# Patient Record
Sex: Female | Born: 1960 | Race: White | Hispanic: No | Marital: Married | Smoking: Never smoker
Health system: Southern US, Community
[De-identification: ages and names within clinical notes are randomized; demographics above are authoritative.]

## PROBLEM LIST (undated history)

## (undated) DIAGNOSIS — Z789 Other specified health status: Secondary | ICD-10-CM

## (undated) HISTORY — PX: ABDOMINAL HYSTERECTOMY: SHX81

---

## 2018-03-06 DIAGNOSIS — K449 Diaphragmatic hernia without obstruction or gangrene: Secondary | ICD-10-CM

## 2018-03-06 DIAGNOSIS — R634 Abnormal weight loss: Secondary | ICD-10-CM | POA: Diagnosis not present

## 2018-03-06 DIAGNOSIS — R195 Other fecal abnormalities: Secondary | ICD-10-CM

## 2018-03-06 DIAGNOSIS — D509 Iron deficiency anemia, unspecified: Secondary | ICD-10-CM

## 2018-03-07 DIAGNOSIS — R195 Other fecal abnormalities: Secondary | ICD-10-CM | POA: Diagnosis not present

## 2018-03-07 DIAGNOSIS — K449 Diaphragmatic hernia without obstruction or gangrene: Secondary | ICD-10-CM | POA: Diagnosis not present

## 2018-03-07 DIAGNOSIS — D509 Iron deficiency anemia, unspecified: Secondary | ICD-10-CM | POA: Diagnosis not present

## 2018-03-07 DIAGNOSIS — R634 Abnormal weight loss: Secondary | ICD-10-CM | POA: Diagnosis not present

## 2018-03-08 DIAGNOSIS — D509 Iron deficiency anemia, unspecified: Secondary | ICD-10-CM | POA: Diagnosis not present

## 2018-03-08 DIAGNOSIS — R634 Abnormal weight loss: Secondary | ICD-10-CM | POA: Diagnosis not present

## 2018-03-08 DIAGNOSIS — K449 Diaphragmatic hernia without obstruction or gangrene: Secondary | ICD-10-CM | POA: Diagnosis not present

## 2018-03-08 DIAGNOSIS — R195 Other fecal abnormalities: Secondary | ICD-10-CM | POA: Diagnosis not present

## 2018-03-09 DIAGNOSIS — D509 Iron deficiency anemia, unspecified: Secondary | ICD-10-CM | POA: Diagnosis not present

## 2018-03-09 DIAGNOSIS — R195 Other fecal abnormalities: Secondary | ICD-10-CM | POA: Diagnosis not present

## 2018-03-09 DIAGNOSIS — Z791 Long term (current) use of non-steroidal anti-inflammatories (NSAID): Secondary | ICD-10-CM

## 2018-03-09 DIAGNOSIS — K259 Gastric ulcer, unspecified as acute or chronic, without hemorrhage or perforation: Secondary | ICD-10-CM

## 2018-08-01 DIAGNOSIS — Z01818 Encounter for other preprocedural examination: Secondary | ICD-10-CM | POA: Diagnosis not present

## 2018-11-30 HISTORY — PX: HERNIA REPAIR: SHX51

## 2019-01-23 DIAGNOSIS — K432 Incisional hernia without obstruction or gangrene: Secondary | ICD-10-CM | POA: Diagnosis not present

## 2019-01-23 DIAGNOSIS — K449 Diaphragmatic hernia without obstruction or gangrene: Secondary | ICD-10-CM

## 2019-01-23 DIAGNOSIS — R079 Chest pain, unspecified: Secondary | ICD-10-CM | POA: Diagnosis not present

## 2019-01-23 DIAGNOSIS — R509 Fever, unspecified: Secondary | ICD-10-CM | POA: Diagnosis not present

## 2019-01-23 DIAGNOSIS — R0789 Other chest pain: Secondary | ICD-10-CM | POA: Diagnosis not present

## 2019-01-24 DIAGNOSIS — K449 Diaphragmatic hernia without obstruction or gangrene: Secondary | ICD-10-CM | POA: Diagnosis not present

## 2019-01-24 DIAGNOSIS — R0789 Other chest pain: Secondary | ICD-10-CM

## 2019-01-24 DIAGNOSIS — R079 Chest pain, unspecified: Secondary | ICD-10-CM | POA: Diagnosis not present

## 2019-01-24 DIAGNOSIS — K432 Incisional hernia without obstruction or gangrene: Secondary | ICD-10-CM | POA: Diagnosis not present

## 2019-01-24 DIAGNOSIS — R509 Fever, unspecified: Secondary | ICD-10-CM | POA: Diagnosis not present

## 2019-02-01 ENCOUNTER — Ambulatory Visit: Payer: Self-pay | Admitting: General Surgery

## 2019-02-01 NOTE — H&P (Signed)
History of Present Illness Karina Gomez(Tiquan Bouch MD; 02/01/2019 2:24 PM) The patient is a 58 year old female who presents with a hiatal hernia. Chief Complaint: Recurrent hiatal hernia  Patient is a 58 year old female with no past medical history who comes in for recurrent hiatal hernia. Patient previously underwent a laparoscopic hiatal hernia repair with Nissen fundoplication by Dr. Lequita HaltMorgan in March 2020. Patient states that thereafter she was seen in the ER secondary to tachycardia. Patient states that she underwent stress test which was negative for any cardiac events. Patient also underwent CT scan. CT scan was significant for recurrent hiatal hernia, type for with transverse colon involved. Patient states that she's had no pain. She states that she's had no issues with reflux constipation. Patient states that she does still have tachycardic events sporadically.    Diagnostic Studies History Renee Ramus(Armen Ferguson, CMA; 02/01/2019 1:58 PM) Mammogram  within last year  Allergies Renee Ramus(Armen Ferguson, CMA; 02/01/2019 2:01 PM) Morphine Sulfate (PF) *ANALGESICS - OPIOID*   Medication History (Armen Ferguson, CMA; 02/01/2019 2:01 PM) Pantoprazole Sodium (40MG  Tablet DR, Oral) Active. Propranolol HCl (20MG  Tablet, Oral) Active. Medications Reconciled  Pregnancy / Birth History Renee Ramus(Armen Ferguson, CMA; 02/01/2019 1:58 PM) Age at menarche  14 years.  Other Problems Renee Ramus(Armen Ferguson, CMA; 02/01/2019 1:58 PM) Migraine Headache     Review of Systems Karina Gomez(Yoona Ishii MD; 02/01/2019 2:04 PM) General Not Present- Appetite Loss, Chills, Fatigue, Fever, Night Sweats, Weight Gain and Weight Loss. Skin Not Present- Change in Wart/Mole, Dryness, Hives, Jaundice, New Lesions, Non-Healing Wounds, Rash and Ulcer. HEENT Present- Wears glasses/contact lenses. Not Present- Earache, Hearing Loss, Hoarseness, Nose Bleed, Oral Ulcers, Ringing in the Ears, Seasonal Allergies, Sinus Pain, Sore Throat, Visual Disturbances and  Yellow Eyes. Breast Not Present- Breast Mass, Breast Pain, Nipple Discharge and Skin Changes. Cardiovascular Not Present- Chest Pain, Difficulty Breathing Lying Down, Leg Cramps, Palpitations, Rapid Heart Rate, Shortness of Breath and Swelling of Extremities. Gastrointestinal Not Present- Abdominal Pain, Bloating, Bloody Stool, Change in Bowel Habits, Chronic diarrhea, Constipation, Difficulty Swallowing, Excessive gas, Gets full quickly at meals, Hemorrhoids, Indigestion, Nausea, Rectal Pain and Vomiting. Musculoskeletal Not Present- Back Pain, Joint Pain, Joint Stiffness, Muscle Pain, Muscle Weakness and Swelling of Extremities. Neurological Not Present- Decreased Memory, Fainting, Headaches, Numbness, Seizures, Tingling, Tremor, Trouble walking and Weakness. Psychiatric Not Present- Anxiety, Bipolar, Change in Sleep Pattern, Depression, Fearful and Frequent crying. Endocrine Not Present- Cold Intolerance, Excessive Hunger, Hair Changes, Heat Intolerance and New Diabetes. Hematology Not Present- Blood Thinners, Easy Bruising, Excessive bleeding, Gland problems, HIV and Persistent Infections. All other systems negative  Vitals (Armen Ferguson CMA; 02/01/2019 2:01 PM) 02/01/2019 2:00 PM Weight: 117 lb Height: 63in Body Surface Area: 1.54 m Body Mass Index: 20.73 kg/m  Temp.: 98.11F  Pulse: 80 (Regular)  P.OX: 98% (Room air) BP: 118/72(Sitting, Left Arm, Standard)       Physical Exam Karina Gomez(Kenney Going MD; 02/01/2019 2:24 PM) The physical exam findings are as follows: Note: Constitutional: No acute distress, conversant, appears stated age  Eyes: Anicteric sclerae, moist conjunctiva, no lid lag  Neck: No thyromegaly, trachea midline, no cervical lymphadenopathy  Lungs: Clear to auscultation biilaterally, normal respiratory effot  Cardiovascular: regular rate & rhythm, no murmurs, no peripheal edema, pedal pulses 2+  GI: Soft, no masses or hepatosplenomegaly, non-tender to  palpation  MSK: Normal gait, no clubbing cyanosis, edema  Skin: No rashes, palpation reveals normal skin turgor  Psychiatric: Appropriate judgment and insight, oriented to person, place, and time  Abdomen Inspection  Assessment & Plan Ralene Ok MD; 02/01/2019 2:27 PM)  HIATAL HERNIA (K44.9) Impression: Patient is a 58 year old female with a type IV recurrent paraesophageal hernia. I discussed with the patient the pathophysiology of the recurrent hiatal hernia. Prior to surgery we'll obtain an upper GI/esophagogram, gastric emptying study.  1. Will proceed to the operating room for a robotic hiatal hernia repair with mesh and toupet Fundoplication  2. Discussed with patient the risks and benefits of the procedure to include but not limited to: Infection, bleeding, damage to structures, possible pneumothorax, these nerve damage, possible gastroparesis, possible recurrence. I discussed with her there is a higher chance of complications due to her recurrence and previous surgery. The patient voiced understanding and wishes to proceed.

## 2019-02-02 ENCOUNTER — Other Ambulatory Visit: Payer: Self-pay | Admitting: General Surgery

## 2019-02-02 ENCOUNTER — Other Ambulatory Visit (HOSPITAL_COMMUNITY): Payer: Self-pay | Admitting: General Surgery

## 2019-02-02 DIAGNOSIS — K449 Diaphragmatic hernia without obstruction or gangrene: Secondary | ICD-10-CM

## 2019-02-16 ENCOUNTER — Ambulatory Visit (HOSPITAL_COMMUNITY)
Admission: RE | Admit: 2019-02-16 | Discharge: 2019-02-16 | Disposition: A | Discharge status: Home / Self Care | Payer: Commercial Managed Care - PPO | Source: Ambulatory Visit | Attending: General Surgery | Admitting: General Surgery

## 2019-02-16 ENCOUNTER — Other Ambulatory Visit: Payer: Self-pay

## 2019-02-16 DIAGNOSIS — K449 Diaphragmatic hernia without obstruction or gangrene: Secondary | ICD-10-CM

## 2019-02-23 ENCOUNTER — Encounter (HOSPITAL_COMMUNITY)
Admission: RE | Admit: 2019-02-23 | Discharge: 2019-02-23 | Disposition: A | Discharge status: Home / Self Care | Payer: Commercial Managed Care - PPO | Source: Ambulatory Visit | Attending: General Surgery | Admitting: General Surgery

## 2019-02-23 ENCOUNTER — Other Ambulatory Visit: Payer: Self-pay

## 2019-02-23 DIAGNOSIS — K449 Diaphragmatic hernia without obstruction or gangrene: Secondary | ICD-10-CM | POA: Diagnosis not present

## 2019-02-23 MED ORDER — TECHNETIUM TC 99M SULFUR COLLOID
2.0600 | Freq: Once | INTRAVENOUS | Status: AC | PRN
  Administered 2019-02-23: 2.06 via INTRAVENOUS

## 2019-05-16 ENCOUNTER — Other Ambulatory Visit (HOSPITAL_COMMUNITY)
Admission: RE | Admit: 2019-05-16 | Discharge: 2019-05-16 | Disposition: A | Discharge status: Home / Self Care | Payer: Commercial Managed Care - PPO | Source: Ambulatory Visit | Attending: General Surgery | Admitting: General Surgery

## 2019-05-16 ENCOUNTER — Ambulatory Visit: Payer: Self-pay | Admitting: General Surgery

## 2019-05-16 ENCOUNTER — Other Ambulatory Visit (HOSPITAL_COMMUNITY): Payer: Commercial Managed Care - PPO

## 2019-05-16 DIAGNOSIS — Z01812 Encounter for preprocedural laboratory examination: Secondary | ICD-10-CM | POA: Insufficient documentation

## 2019-05-16 DIAGNOSIS — Z20828 Contact with and (suspected) exposure to other viral communicable diseases: Secondary | ICD-10-CM | POA: Insufficient documentation

## 2019-05-16 NOTE — Patient Instructions (Addendum)
DUE TO COVID-19 ONLY ONE VISITOR IS ALLOWED TO COME WITH YOU AND STAY IN THE WAITING ROOM ONLY DURING PRE OP AND PROCEDURE DAY OF SURGERY. THE 1 VISITOR MAY VISIT WITH YOU AFTER SURGERY IN YOUR PRIVATE ROOM DURING VISITING HOURS ONLY! YOU NEED TO HAVE A COVID 19 TEST ON___12/15____ @__9 :00_____, THIS TEST MUST BE DONE BEFORE SURGERY, COME  [address], [place] , .  [facility]) ONCE YOUR COVID TEST IS COMPLETED, PLEASE BEGIN THE QUARANTINE INSTRUCTIONS AS OUTLINED IN YOUR HANDOUT.                Nellene Courtois    Your procedure is scheduled on: 05/19/19   Report to [facility] Main  Entrance   Report to admitting at  11:30 AM     Call this number if you have problems the morning of surgery [phone]       BRUSH YOUR TEETH MORNING OF SURGERY AND RINSE YOUR MOUTH OUT, NO CHEWING GUM CANDY OR MINTS.   Do not eat food After Midnight.   YOU MAY HAVE CLEAR LIQUIDS FROM MIDNIGHT UNTIL 10:30 AM    CLEAR LIQUID DIET   Foods Allowed                                                                     Foods Excluded  Coffee and tea, regular and decaf                             liquids that you cannot  Plain Jell-O any favor except red or purple                                           see through such as: Fruit ices (not with fruit pulp)                                     milk, soups, orange juice  Iced Popsicles                                    All solid food Carbonated beverages, regular and diet                                    Cranberry, grape and apple juices Sports drinks like Gatorade Lightly seasoned clear broth or consume(fat free) Sugar, honey syrup     . At 10:30 AM Please finish the prescribed Pre-Surgery  drink.   Nothing by mouth after you finish the  drink !    Take these medicines the morning of surgery with A SIP OF WATER: none                                 You may not have any metal on your body  including hair pins and              piercings  Do not wear jewelry, make-up, lotions, powders or perfumes, deodorant             Do not wear nail polish on your fingernails.  Do not shave  48 hours prior to surgery.               Do not bring valuables to the hospital. [place].  Contacts, dentures or bridgework may not be worn into surgery.      Patients discharged the day of surgery will not be allowed to drive home.   IF YOU ARE HAVING SURGERY AND GOING HOME THE SAME DAY, YOU MUST HAVE AN ADULT TO DRIVE YOU HOME AND BE WITH YOU FOR 24 HOURS.   YOU MAY GO HOME BY TAXI OR UBER OR ORTHERWISE, BUT AN ADULT MUST ACCOMPANY YOU HOME AND STAY WITH YOU FOR 24 HOURS.  Name and phone number of your driver:  Special Instructions: N/A              Please read over the following fact sheets you were given: _____________________________________________________________________             [facility] - Preparing for Surgery  Before surgery, you can play an important role.    Because skin is not sterile, your skin needs to be as free of germs as possible.   You can reduce the number of germs on your skin by washing with CHG (chlorahexidine gluconate) soap before surgery.  CHG is an antiseptic cleaner which kills germs and bonds with the skin to continue killing germs even after washing. Please DO NOT use if you have an allergy to CHG or antibacterial soaps.   If your skin becomes reddened/irritated stop using the CHG and inform your nurse when you arrive at Short Stay. Do not shave (including legs and underarms) for at least 48 hours prior to the first CHG shower.    Please follow these instructions carefully:  1.  Shower with CHG Soap the night before surgery and the  morning of Surgery.  2.  If you choose to wash your hair, wash your hair first as usual with your  normal  shampoo.  3.  After you shampoo, rinse your hair and body thoroughly  to remove the  shampoo.                                        4.  Use CHG as you would any other liquid soap.  You can apply chg directly  to the skin and wash                       Gently with a scrungie or clean washcloth.  5.  Apply the CHG Soap to your body ONLY FROM THE NECK DOWN.   Do not use on face/ open                           Wound or open sores. Avoid contact with eyes, ears mouth and genitals (private parts).  Wash face,  Genitals (private parts) with your normal soap.             6.  Wash thoroughly, paying special attention to the area where your surgery  will be performed.  7.  Thoroughly rinse your body with warm water from the neck down.  8.  DO NOT shower/wash with your normal soap after using and rinsing off  the CHG Soap.             9.  Pat yourself dry with a clean towel.            10.  Wear clean pajamas.            11.  Place clean sheets on your bed the night of your first shower and do not  sleep with pets. Day of Surgery : Do not apply any lotions/deodorants the morning of surgery.  Please wear clean clothes to the hospital/surgery center.  FAILURE TO FOLLOW THESE INSTRUCTIONS MAY RESULT IN THE CANCELLATION OF YOUR SURGERY PATIENT SIGNATURE_________________________________  NURSE SIGNATURE__________________________________  ________________________________________________________________________

## 2019-05-17 ENCOUNTER — Encounter (HOSPITAL_COMMUNITY): Payer: Self-pay | Admitting: *Deleted

## 2019-05-17 ENCOUNTER — Inpatient Hospital Stay (HOSPITAL_COMMUNITY)
Admission: RE | Admit: 2019-05-17 | Discharge: 2019-05-17 | Disposition: A | Discharge status: Home / Self Care | Payer: Commercial Managed Care - PPO | Source: Ambulatory Visit

## 2019-05-17 ENCOUNTER — Other Ambulatory Visit (HOSPITAL_COMMUNITY): Payer: Self-pay | Admitting: *Deleted

## 2019-05-17 ENCOUNTER — Other Ambulatory Visit: Payer: Self-pay

## 2019-05-17 HISTORY — DX: Other specified health status: Z78.9

## 2019-05-17 LAB — NOVEL CORONAVIRUS, NAA (HOSP ORDER, SEND-OUT TO REF LAB; TAT 18-24 HRS): SARS-CoV-2, NAA: NOT DETECTED

## 2019-05-17 NOTE — Progress Notes (Signed)
PCP - Dr Chauncey Cruel. [place] Cardiologist - none  Chest x-ray -  no EKG - no Stress Test - no ECHO - no Cardiac Cath - no  Sleep Study - no CPAP -   Fasting Blood Sugar - NA Checks Blood Sugar _____ times a day  Blood Thinner Instructions: NA Aspirin Instructions: Last Dose:  Anesthesia review:   Patient denies shortness of breath, fever, cough and chest pain at PAT appointment yes  Patient verbalized understanding of instructions that were given to them at the PAT appointment. Patient was also instructed that they will need to review over the PAT instructions again at home before surgery. yes

## 2019-05-18 ENCOUNTER — Encounter (HOSPITAL_COMMUNITY)
Admission: RE | Admit: 2019-05-18 | Discharge: 2019-05-18 | Disposition: A | Discharge status: Home / Self Care | Payer: Commercial Managed Care - PPO | Source: Ambulatory Visit | Attending: General Surgery | Admitting: General Surgery

## 2019-05-18 DIAGNOSIS — Z01812 Encounter for preprocedural laboratory examination: Secondary | ICD-10-CM | POA: Insufficient documentation

## 2019-05-18 LAB — CBC
HCT: 41.7 % (ref 36.0–46.0)
Hemoglobin: 13.8 g/dL (ref 12.0–15.0)
MCH: 30.5 pg (ref 26.0–34.0)
MCHC: 33.1 g/dL (ref 30.0–36.0)
MCV: 92.1 fL (ref 80.0–100.0)
Platelets: 175 10*3/uL (ref 150–400)
RBC: 4.53 MIL/uL (ref 3.87–5.11)
RDW: 13.1 % (ref 11.5–15.5)
WBC: 4.1 10*3/uL (ref 4.0–10.5)
nRBC: 0 % (ref 0.0–0.2)

## 2019-05-18 NOTE — H&P (Signed)
History of Present Illness The patient is a 58 year old female who presents with a hiatal hernia.  Chief Complaint: Recurrent hiatal hernia  Patient is a 58 year old female with no past medical history who comes in for recurrent hiatal hernia. Patient previously underwent a laparoscopic hiatal hernia repair with Nissen fundoplication by Dr. Lilia Pro in March 2020. Patient states that thereafter she was seen in the ER secondary to tachycardia. Patient states that she underwent stress test which was negative for any cardiac events. Patient also underwent CT scan. CT scan was significant for recurrent hiatal hernia, type for with transverse colon involved. Patient states that she's had no pain. She states that she's had no issues with reflux constipation. Patient states that she does still have tachycardic events sporadically.  Diagnostic Studies History Mammogram within last year  Allergies Morphine Sulfate (PF) *ANALGESICS - OPIOID*  Medication History Pantoprazole Sodium (40MG  Tablet DR, Oral) Active.  Propranolol HCl (20MG  Tablet, Oral) Active.  Medications Reconciled  Pregnancy / Birth History  Age at menarche 74 years.  Other Problems ( Migraine Headache  Review of Systems General Not Present- Appetite Loss, Chills, Fatigue, Fever, Night Sweats, Weight Gain and Weight Loss.  Skin Not Present- Change in Wart/Mole, Dryness, Hives, Jaundice, New Lesions, Non-Healing Wounds, Rash and Ulcer.  HEENT Present- Wears glasses/contact lenses. Not Present- Earache, Hearing Loss, Hoarseness, Nose Bleed, Oral Ulcers, Ringing in the Ears, Seasonal Allergies, Sinus Pain, Sore Throat, Visual Disturbances and Yellow Eyes.  Breast Not Present- Breast Mass, Breast Pain, Nipple Discharge and Skin Changes.  Cardiovascular Not Present- Chest Pain, Difficulty Breathing Lying Down, Leg Cramps, Palpitations, Rapid Heart Rate, Shortness of Breath and Swelling of Extremities.  Gastrointestinal Not Present- Abdominal  Pain, Bloating, Bloody Stool, Change in Bowel Habits, Chronic diarrhea, Constipation, Difficulty Swallowing, Excessive gas, Gets full quickly at meals, Hemorrhoids, Indigestion, Nausea, Rectal Pain and Vomiting.  Musculoskeletal Not Present- Back Pain, Joint Pain, Joint Stiffness, Muscle Pain, Muscle Weakness and Swelling of Extremities.  Neurological Not Present- Decreased Memory, Fainting, Headaches, Numbness, Seizures, Tingling, Tremor, Trouble walking and Weakness.  Psychiatric Not Present- Anxiety, Bipolar, Change in Sleep Pattern, Depression, Fearful and Frequent crying.  Endocrine Not Present- Cold Intolerance, Excessive Hunger, Hair Changes, Heat Intolerance and New Diabetes.  Hematology Not Present- Blood Thinners, Easy Bruising, Excessive bleeding, Gland problems, HIV and Persistent Infections.  All other systems negative  Vitals9/07/2018 2:00 PM  Weight: 117 lb Height: 63 in  Body Surface Area: 1.54 m Body Mass Index: 20.73 kg/m  Temp.: 98.1 F Pulse: 80 (Regular) P.OX: 98% (Room air)  BP: 118/72(Sitting, Left Arm, Standard)  Physical Exam  The physical exam findings are as follows:  Note: Constitutional: No acute distress, conversant, appears stated age  Eyes: Anicteric sclerae, moist conjunctiva, no lid lag  Neck: No thyromegaly, trachea midline, no cervical lymphadenopathy  Lungs: Clear to auscultation biilaterally, normal respiratory effot  Cardiovascular: regular rate & rhythm, no murmurs, no peripheal edema, pedal pulses 2+  GI: Soft, no masses or hepatosplenomegaly, non-tender to palpation  MSK: Normal gait, no clubbing cyanosis, edema  Skin: No rashes, palpation reveals normal skin turgor  Psychiatric: Appropriate judgment and insight, oriented to person, place, and time  Abdomen  Inspection  Assessment & Plan  HIATAL HERNIA (K44.9)  Impression: Patient is a 58 year old female with a type IV recurrent paraesophageal hernia.  I discussed with the patient the  pathophysiology of the recurrent hiatal hernia.  Prior to surgery we'll obtain an upper GI/esophagogram, gastric emptying study.  1.  Will proceed to the operating room for a robotic hiatal hernia repair with mesh and toupet Fundoplication  2. Discussed with patient the risks and benefits of the procedure to include but not limited to: Infection, bleeding, damage to structures, possible pneumothorax, these nerve damage, possible gastroparesis, possible recurrence. I discussed with her there is a higher chance of complications due to her recurrence and previous surgery. The patient voiced understanding and wishes to proceed.

## 2019-05-19 ENCOUNTER — Encounter (HOSPITAL_COMMUNITY)
Admission: RE | Disposition: A | Discharge status: Home / Self Care | Payer: Self-pay | Source: Home / Self Care | Attending: General Surgery

## 2019-05-19 ENCOUNTER — Inpatient Hospital Stay (HOSPITAL_COMMUNITY)
Admission: RE | Admit: 2019-05-19 | Discharge: 2019-05-21 | DRG: 327 | Disposition: A | Discharge status: Home / Self Care | Payer: Commercial Managed Care - PPO | Attending: General Surgery | Admitting: General Surgery

## 2019-05-19 ENCOUNTER — Encounter (HOSPITAL_COMMUNITY): Payer: Self-pay | Admitting: General Surgery

## 2019-05-19 ENCOUNTER — Other Ambulatory Visit: Payer: Self-pay

## 2019-05-19 ENCOUNTER — Inpatient Hospital Stay (HOSPITAL_COMMUNITY): Payer: Commercial Managed Care - PPO | Admitting: Physician Assistant

## 2019-05-19 ENCOUNTER — Inpatient Hospital Stay (HOSPITAL_COMMUNITY): Payer: Commercial Managed Care - PPO | Admitting: Certified Registered"

## 2019-05-19 DIAGNOSIS — K9189 Other postprocedural complications and disorders of digestive system: Principal | ICD-10-CM | POA: Diagnosis present

## 2019-05-19 DIAGNOSIS — Y838 Other surgical procedures as the cause of abnormal reaction of the patient, or of later complication, without mention of misadventure at the time of the procedure: Secondary | ICD-10-CM | POA: Diagnosis present

## 2019-05-19 DIAGNOSIS — Y69 Unspecified misadventure during surgical and medical care: Secondary | ICD-10-CM | POA: Diagnosis not present

## 2019-05-19 DIAGNOSIS — K449 Diaphragmatic hernia without obstruction or gangrene: Secondary | ICD-10-CM

## 2019-05-19 DIAGNOSIS — K9171 Accidental puncture and laceration of a digestive system organ or structure during a digestive system procedure: Secondary | ICD-10-CM | POA: Diagnosis not present

## 2019-05-19 DIAGNOSIS — K660 Peritoneal adhesions (postprocedural) (postinfection): Secondary | ICD-10-CM | POA: Diagnosis present

## 2019-05-19 DIAGNOSIS — Z20828 Contact with and (suspected) exposure to other viral communicable diseases: Secondary | ICD-10-CM | POA: Diagnosis present

## 2019-05-19 DIAGNOSIS — Z9889 Other specified postprocedural states: Secondary | ICD-10-CM

## 2019-05-19 SURGERY — FUNDOPLICATION, NISSEN, ROBOT-ASSISTED, LAPAROSCOPIC
Anesthesia: General | Site: Abdomen

## 2019-05-19 MED ORDER — SCOPOLAMINE 1 MG/3DAYS TD PT72
MEDICATED_PATCH | TRANSDERMAL | Status: AC
  Filled 2019-05-19: qty 1

## 2019-05-19 MED ORDER — HYDROMORPHONE HCL 1 MG/ML IJ SOLN: 0.2500 mg | INTRAMUSCULAR | Status: DC | PRN

## 2019-05-19 MED ORDER — LACTATED RINGERS IR SOLN
Status: DC | PRN
  Administered 2019-05-19: 1000 mL

## 2019-05-19 MED ORDER — HYDROMORPHONE HCL 1 MG/ML IJ SOLN
1.0000 mg | INTRAMUSCULAR | Status: DC | PRN
  Administered 2019-05-19 – 2019-05-21 (×4): 1 mg via INTRAVENOUS
  Filled 2019-05-19 (×4): qty 1

## 2019-05-19 MED ORDER — ENSURE PRE-SURGERY PO LIQD
592.0000 mL | Freq: Once | ORAL | Status: DC
  Filled 2019-05-19: qty 592

## 2019-05-19 MED ORDER — LIDOCAINE 2% (20 MG/ML) 5 ML SYRINGE
INTRAMUSCULAR | Status: AC
  Filled 2019-05-19: qty 5

## 2019-05-19 MED ORDER — SUGAMMADEX SODIUM 200 MG/2ML IV SOLN
INTRAVENOUS | Status: DC | PRN
  Administered 2019-05-19: 200 mg via INTRAVENOUS

## 2019-05-19 MED ORDER — DEXAMETHASONE SODIUM PHOSPHATE 10 MG/ML IJ SOLN
INTRAMUSCULAR | Status: AC
  Filled 2019-05-19: qty 1

## 2019-05-19 MED ORDER — FENTANYL CITRATE (PF) 100 MCG/2ML IJ SOLN
INTRAMUSCULAR | Status: AC
  Filled 2019-05-19: qty 2

## 2019-05-19 MED ORDER — ONDANSETRON HCL 4 MG/2ML IJ SOLN
INTRAMUSCULAR | Status: AC
  Filled 2019-05-19: qty 2

## 2019-05-19 MED ORDER — ONDANSETRON HCL 4 MG/2ML IJ SOLN
4.0000 mg | Freq: Four times a day (QID) | INTRAMUSCULAR | Status: DC | PRN
  Administered 2019-05-20: 4 mg via INTRAVENOUS
  Filled 2019-05-19: qty 2

## 2019-05-19 MED ORDER — LIDOCAINE 20MG/ML (2%) 15 ML SYRINGE OPTIME
INTRAMUSCULAR | Status: DC | PRN
  Administered 2019-05-19: 1.5 mg/kg/h via INTRAVENOUS

## 2019-05-19 MED ORDER — KETAMINE HCL 10 MG/ML IJ SOLN
INTRAMUSCULAR | Status: DC | PRN
  Administered 2019-05-19 (×2): 10 mg via INTRAVENOUS
  Administered 2019-05-19: 15 mg via INTRAVENOUS

## 2019-05-19 MED ORDER — ENSURE PRE-SURGERY PO LIQD
296.0000 mL | Freq: Once | ORAL | Status: DC
  Filled 2019-05-19: qty 296

## 2019-05-19 MED ORDER — PHENYLEPHRINE 40 MCG/ML (10ML) SYRINGE FOR IV PUSH (FOR BLOOD PRESSURE SUPPORT)
PREFILLED_SYRINGE | INTRAVENOUS | Status: DC | PRN
  Administered 2019-05-19: 40 ug via INTRAVENOUS
  Administered 2019-05-19: 80 ug via INTRAVENOUS
  Administered 2019-05-19: 40 ug via INTRAVENOUS

## 2019-05-19 MED ORDER — DEXAMETHASONE SODIUM PHOSPHATE 10 MG/ML IJ SOLN
INTRAMUSCULAR | Status: DC | PRN
  Administered 2019-05-19: 8 mg via INTRAVENOUS

## 2019-05-19 MED ORDER — PROPOFOL 10 MG/ML IV BOLUS
INTRAVENOUS | Status: AC
  Filled 2019-05-19: qty 20

## 2019-05-19 MED ORDER — 0.9 % SODIUM CHLORIDE (POUR BTL) OPTIME
TOPICAL | Status: DC | PRN
  Administered 2019-05-19: 1000 mL

## 2019-05-19 MED ORDER — FENTANYL CITRATE (PF) 250 MCG/5ML IJ SOLN
INTRAMUSCULAR | Status: DC | PRN
  Administered 2019-05-19: 50 ug via INTRAVENOUS
  Administered 2019-05-19: 100 ug via INTRAVENOUS
  Administered 2019-05-19: 50 ug via INTRAVENOUS

## 2019-05-19 MED ORDER — DEXTROSE-NACL 5-0.9 % IV SOLN: INTRAVENOUS | Status: DC

## 2019-05-19 MED ORDER — LACTATED RINGERS IV SOLN: INTRAVENOUS | Status: DC

## 2019-05-19 MED ORDER — ONDANSETRON HCL 4 MG/2ML IJ SOLN
INTRAMUSCULAR | Status: DC | PRN
  Administered 2019-05-19 (×2): 4 mg via INTRAVENOUS

## 2019-05-19 MED ORDER — ROCURONIUM BROMIDE 10 MG/ML (PF) SYRINGE
PREFILLED_SYRINGE | INTRAVENOUS | Status: AC
  Filled 2019-05-19: qty 10

## 2019-05-19 MED ORDER — BUPIVACAINE HCL (PF) 0.25 % IJ SOLN
INTRAMUSCULAR | Status: DC | PRN
  Administered 2019-05-19: 18 mL

## 2019-05-19 MED ORDER — PHENYLEPHRINE HCL-NACL 10-0.9 MG/250ML-% IV SOLN
INTRAVENOUS | Status: DC | PRN
  Administered 2019-05-19: 20 ug/min via INTRAVENOUS

## 2019-05-19 MED ORDER — ONDANSETRON 4 MG PO TBDP: 4.0000 mg | ORAL_TABLET | Freq: Four times a day (QID) | ORAL | Status: DC | PRN

## 2019-05-19 MED ORDER — SCOPOLAMINE 1 MG/3DAYS TD PT72
MEDICATED_PATCH | TRANSDERMAL | Status: DC | PRN
  Administered 2019-05-19: 1 via TRANSDERMAL

## 2019-05-19 MED ORDER — MIDAZOLAM HCL 2 MG/2ML IJ SOLN
INTRAMUSCULAR | Status: DC | PRN
  Administered 2019-05-19: 2 mg via INTRAVENOUS

## 2019-05-19 MED ORDER — ENOXAPARIN SODIUM 40 MG/0.4ML ~~LOC~~ SOLN
40.0000 mg | SUBCUTANEOUS | Status: DC
  Administered 2019-05-20 – 2019-05-21 (×2): 40 mg via SUBCUTANEOUS
  Filled 2019-05-19 (×2): qty 0.4

## 2019-05-19 MED ORDER — LIDOCAINE 2% (20 MG/ML) 5 ML SYRINGE
INTRAMUSCULAR | Status: DC | PRN
  Administered 2019-05-19: 60 mg via INTRAVENOUS

## 2019-05-19 MED ORDER — ROCURONIUM BROMIDE 10 MG/ML (PF) SYRINGE
PREFILLED_SYRINGE | INTRAVENOUS | Status: DC | PRN
  Administered 2019-05-19: 20 mg via INTRAVENOUS
  Administered 2019-05-19: 10 mg via INTRAVENOUS
  Administered 2019-05-19: 20 mg via INTRAVENOUS
  Administered 2019-05-19: 40 mg via INTRAVENOUS
  Administered 2019-05-19: 20 mg via INTRAVENOUS

## 2019-05-19 MED ORDER — SUCCINYLCHOLINE CHLORIDE 200 MG/10ML IV SOSY
PREFILLED_SYRINGE | INTRAVENOUS | Status: DC | PRN
  Administered 2019-05-19: 60 mg via INTRAVENOUS

## 2019-05-19 MED ORDER — KETAMINE HCL 10 MG/ML IJ SOLN
INTRAMUSCULAR | Status: AC
  Filled 2019-05-19: qty 1

## 2019-05-19 MED ORDER — PROMETHAZINE HCL 25 MG/ML IJ SOLN: 6.2500 mg | INTRAMUSCULAR | Status: DC | PRN

## 2019-05-19 MED ORDER — CHLORHEXIDINE GLUCONATE CLOTH 2 % EX PADS: 6.0000 | MEDICATED_PAD | Freq: Once | CUTANEOUS | Status: DC

## 2019-05-19 MED ORDER — GLYCOPYRROLATE 0.2 MG/ML IJ SOLN
INTRAMUSCULAR | Status: DC | PRN
  Administered 2019-05-19: .2 mg via INTRAVENOUS

## 2019-05-19 MED ORDER — CEFAZOLIN SODIUM-DEXTROSE 2-4 GM/100ML-% IV SOLN
2.0000 g | INTRAVENOUS | Status: AC
  Administered 2019-05-19 (×2): 2 g via INTRAVENOUS
  Filled 2019-05-19: qty 100

## 2019-05-19 MED ORDER — MIDAZOLAM HCL 2 MG/2ML IJ SOLN
INTRAMUSCULAR | Status: AC
  Filled 2019-05-19: qty 2

## 2019-05-19 MED ORDER — PHENYLEPHRINE 40 MCG/ML (10ML) SYRINGE FOR IV PUSH (FOR BLOOD PRESSURE SUPPORT)
PREFILLED_SYRINGE | INTRAVENOUS | Status: AC
  Filled 2019-05-19: qty 10

## 2019-05-19 MED ORDER — BUPIVACAINE HCL 0.25 % IJ SOLN
INTRAMUSCULAR | Status: AC
  Filled 2019-05-19: qty 1

## 2019-05-19 MED ORDER — KETOROLAC TROMETHAMINE 30 MG/ML IJ SOLN: 30.0000 mg | Freq: Once | INTRAMUSCULAR | Status: DC | PRN

## 2019-05-19 MED ORDER — ACETAMINOPHEN 500 MG PO TABS
1000.0000 mg | ORAL_TABLET | Freq: Once | ORAL | Status: AC
  Administered 2019-05-19: 11:00:00 1000 mg via ORAL
  Filled 2019-05-19: qty 2

## 2019-05-19 MED ORDER — OXYCODONE HCL 5 MG/5ML PO SOLN: 5.0000 mg | Freq: Once | ORAL | Status: DC | PRN

## 2019-05-19 MED ORDER — PROPOFOL 10 MG/ML IV BOLUS
INTRAVENOUS | Status: DC | PRN
  Administered 2019-05-19: 150 mg via INTRAVENOUS

## 2019-05-19 MED ORDER — ACETAMINOPHEN 500 MG PO TABS: 1000.0000 mg | ORAL_TABLET | ORAL | Status: DC

## 2019-05-19 MED ORDER — MEPERIDINE HCL 50 MG/ML IJ SOLN: 6.2500 mg | INTRAMUSCULAR | Status: DC | PRN

## 2019-05-19 MED ORDER — OXYCODONE HCL 5 MG PO TABS: 5.0000 mg | ORAL_TABLET | Freq: Once | ORAL | Status: DC | PRN

## 2019-05-19 SURGICAL SUPPLY — 59 items
APPLIER CLIP 5 13 M/L LIGAMAX5 (MISCELLANEOUS)
APPLIER CLIP ROT 10 11.4 M/L (STAPLE)
BLADE SURG SZ11 CARB STEEL (BLADE) ×3 IMPLANT
CHLORAPREP W/TINT 26 (MISCELLANEOUS) ×3 IMPLANT
CLIP APPLIE 5 13 M/L LIGAMAX5 (MISCELLANEOUS) IMPLANT
CLIP APPLIE ROT 10 11.4 M/L (STAPLE) IMPLANT
CLIP VESOLOCK LG 6/CT PURPLE (CLIP) IMPLANT
CLIP VESOLOCK MED LG 6/CT (CLIP) IMPLANT
COVER SURGICAL LIGHT HANDLE (MISCELLANEOUS) ×3 IMPLANT
COVER TIP SHEARS 8 DVNC (MISCELLANEOUS) IMPLANT
COVER TIP SHEARS 8MM DA VINCI (MISCELLANEOUS)
COVER WAND RF STERILE (DRAPES) ×3 IMPLANT
DECANTER SPIKE VIAL GLASS SM (MISCELLANEOUS) ×3 IMPLANT
DERMABOND ADVANCED (GAUZE/BANDAGES/DRESSINGS) ×2
DERMABOND ADVANCED .7 DNX12 (GAUZE/BANDAGES/DRESSINGS) ×1 IMPLANT
DRAIN PENROSE 18X1/2 LTX STRL (DRAIN) ×3 IMPLANT
DRAPE ARM DVNC X/XI (DISPOSABLE) ×4 IMPLANT
DRAPE COLUMN DVNC XI (DISPOSABLE) ×1 IMPLANT
DRAPE DA VINCI XI ARM (DISPOSABLE) ×8
DRAPE DA VINCI XI COLUMN (DISPOSABLE) ×2
ELECT REM PT RETURN 15FT ADLT (MISCELLANEOUS) ×3 IMPLANT
ENDOLOOP SUT PDS II  0 18 (SUTURE)
ENDOLOOP SUT PDS II 0 18 (SUTURE) IMPLANT
GAUZE 4X4 16PLY RFD (DISPOSABLE) ×3 IMPLANT
GLOVE BIO SURGEON STRL SZ7.5 (GLOVE) ×6 IMPLANT
GOWN STRL REUS W/TWL XL LVL3 (GOWN DISPOSABLE) ×12 IMPLANT
KIT BASIN OR (CUSTOM PROCEDURE TRAY) ×3 IMPLANT
KIT TURNOVER KIT A (KITS) IMPLANT
MARKER SKIN DUAL TIP RULER LAB (MISCELLANEOUS) ×3 IMPLANT
MESH BIO-A 7X10 SYN MAT (Mesh General) ×2 IMPLANT
NDL INSUFFLATION 14GA 120MM (NEEDLE) ×1 IMPLANT
NEEDLE HYPO 22GX1.5 SAFETY (NEEDLE) ×3 IMPLANT
NEEDLE INSUFFLATION 14GA 120MM (NEEDLE) ×3 IMPLANT
OBTURATOR OPTICAL STANDARD 8MM (TROCAR)
OBTURATOR OPTICAL STND 8 DVNC (TROCAR)
OBTURATOR OPTICALSTD 8 DVNC (TROCAR) IMPLANT
PACK CARDIOVASCULAR III (CUSTOM PROCEDURE TRAY) ×3 IMPLANT
PAD POSITIONING PINK XL (MISCELLANEOUS) ×3 IMPLANT
PENCIL SMOKE EVACUATOR (MISCELLANEOUS) IMPLANT
SCISSORS LAP 5X35 DISP (ENDOMECHANICALS) IMPLANT
SEAL CANN UNIV 5-8 DVNC XI (MISCELLANEOUS) ×4 IMPLANT
SEAL XI 5MM-8MM UNIVERSAL (MISCELLANEOUS) ×8
SEALER VESSEL DA VINCI XI (MISCELLANEOUS) ×2
SEALER VESSEL EXT DVNC XI (MISCELLANEOUS) ×1 IMPLANT
SET IRRIG TUBING LAPAROSCOPIC (IRRIGATION / IRRIGATOR) ×3 IMPLANT
SOL ANTI FOG 6CC (MISCELLANEOUS) ×1 IMPLANT
SOLUTION ANTI FOG 6CC (MISCELLANEOUS) ×2
SOLUTION ELECTROLUBE (MISCELLANEOUS) ×3 IMPLANT
STOPCOCK 4 WAY LG BORE MALE ST (IV SETS) ×4 IMPLANT
SUT ETHIBOND 0 36 GRN (SUTURE) ×6 IMPLANT
SUT MNCRL AB 4-0 PS2 18 (SUTURE) ×3 IMPLANT
SUT SILK 0 SH 30 (SUTURE) ×3 IMPLANT
SUT SILK 2 0 SH (SUTURE) ×6 IMPLANT
SYR 20ML LL LF (SYRINGE) ×3 IMPLANT
TOWEL OR 17X26 10 PK STRL BLUE (TOWEL DISPOSABLE) ×3 IMPLANT
TOWEL OR NON WOVEN STRL DISP B (DISPOSABLE) ×3 IMPLANT
TRAY FOLEY MTR SLVR 16FR STAT (SET/KITS/TRAYS/PACK) IMPLANT
TROCAR ADV FIXATION 5X100MM (TROCAR) ×3 IMPLANT
TUBING INSUFFLATION 10FT LAP (TUBING) ×3 IMPLANT

## 2019-05-19 NOTE — Transfer of Care (Signed)
Immediate Anesthesia Transfer of Care Note  Patient: Karina Gomez  Procedure(s) Performed: XI ROBOTIC ASSISTED REDO HIATAL HERNIA REPAIR WITH MESH AND NISSEN FUNDOPLICATION (N/A Abdomen)  Patient Location: PACU  Anesthesia Type:General  Level of Consciousness: drowsy  Airway & Oxygen Therapy: Patient Spontanous Breathing and Patient connected to face mask oxygen  Post-op Assessment: Report given to RN and Post -op Vital signs reviewed and stable  Post vital signs: Reviewed and stable  Last Vitals:  Vitals Value Taken Time  BP 120/70 05/19/19 1627  Temp    Pulse 82 05/19/19 1628  Resp 18 05/19/19 1628  SpO2 100 % 05/19/19 1628  Vitals shown include unvalidated device data.  Last Pain:  Vitals:   05/19/19 1029  TempSrc:   PainSc: 0-No pain         Complications: No apparent anesthesia complications

## 2019-05-19 NOTE — Interval H&P Note (Signed)
History and Physical Interval Note:  05/19/2019 11:44 AM  Karina Gomez  has presented today for surgery, with the diagnosis of RECURRENT HIATAL HERNIA TYPE IV.  The various methods of treatment have been discussed with the patient and family. After consideration of risks, benefits and other options for treatment, the patient has consented to  Procedure(s): XI ROBOTIC [place] FUNDOPLICATION (N/A) as a surgical intervention.  The patient's history has been reviewed, patient examined, no change in status, stable for surgery.  I have reviewed the patient's chart and labs.  Questions were answered to the patient's satisfaction.     Ralene Ok

## 2019-05-19 NOTE — Anesthesia Preprocedure Evaluation (Addendum)
Anesthesia Evaluation  Patient identified by MRN, date of birth, ID band Patient awake    Reviewed: Allergy & Precautions, NPO status , Patient's Chart, lab work & pertinent test results  Airway Mallampati: II  TM Distance: >3 FB Neck ROM: Full    Dental no notable dental hx. (+) Teeth Intact, Dental Advisory Given   Pulmonary neg pulmonary ROS,    Pulmonary exam normal breath sounds clear to auscultation       Cardiovascular negative cardio ROS Normal cardiovascular exam Rhythm:Regular Rate:Normal     Neuro/Psych negative neurological ROS  negative psych ROS   GI/Hepatic Neg liver ROS, hiatal hernia, GERD  Medicated and Controlled,Recurrent hiatal hernia   Endo/Other  negative endocrine ROS  Renal/GU negative Renal ROS  negative genitourinary   Musculoskeletal negative musculoskeletal ROS (+)   Abdominal Normal abdominal exam  (+)   Peds negative pediatric ROS (+)  Hematology negative hematology ROS (+)   Anesthesia Other Findings HOH   Reproductive/Obstetrics negative OB ROS                            Anesthesia Physical Anesthesia Plan  ASA: III  Anesthesia Plan: General   Post-op Pain Management:    Induction: Intravenous, Cricoid pressure planned and Rapid sequence  PONV Risk Score and Plan: 3 and Ondansetron, Dexamethasone, Midazolam and Treatment may vary due to age or medical condition  Airway Management Planned: Oral ETT  Additional Equipment: None  Intra-op Plan:   Post-operative Plan: Extubation in OR  Informed Consent: I have reviewed the patients History and Physical, chart, labs and discussed the procedure including the risks, benefits and alternatives for the proposed anesthesia with the patient or authorized representative who has indicated his/her understanding and acceptance.     Dental advisory given  Plan Discussed with: CRNA  Anesthesia Plan  Comments:         Anesthesia Quick Evaluation

## 2019-05-19 NOTE — Anesthesia Postprocedure Evaluation (Signed)
Anesthesia Post Note  Patient: Karina Gomez  Procedure(s) Performed: XI ROBOTIC ASSISTED REDO HIATAL HERNIA REPAIR WITH MESH AND NISSEN FUNDOPLICATION (N/A Abdomen)     Patient location during evaluation: PACU Anesthesia Type: General Level of consciousness: awake and alert, oriented and patient cooperative Pain management: pain level controlled Vital Signs Assessment: post-procedure vital signs reviewed and stable Respiratory status: spontaneous breathing, nonlabored ventilation and respiratory function stable Cardiovascular status: blood pressure returned to baseline and stable Postop Assessment: no apparent nausea or vomiting Anesthetic complications: no    Last Vitals:  Vitals:   05/19/19 1815 05/19/19 1845  BP: 130/77 128/76  Pulse: 78 76  Resp: (!) 23 20  Temp:  36.6 C  SpO2: 100% 100%    Last Pain:  Vitals:   05/19/19 1845  TempSrc: Oral  PainSc:                  Pervis Hocking

## 2019-05-19 NOTE — Op Note (Signed)
05/19/2019  3:54 PM  PATIENT:  Karina Gomez  58 y.o. female  PRE-OPERATIVE DIAGNOSIS:  RECURRENT HIATAL HERNIA TYPE IV  POST-OPERATIVE DIAGNOSIS:  RECURRENT HIATAL HERNIA TYPE IV  PROCEDURE:  Procedure(s): XI ROBOTIC ASSISTED REDO HIATAL HERNIA REPAIR WITH MESH AND NISSEN FUNDOPLICATION (N/A)  SURGEON:  Surgeon(s) and Role:    * Ralene Ok, MD - Primary    Michael Boston, MD  ( My assistant was crucial in helping with retraction, dissection, identification of vital structures within the scar tissue.)   ANESTHESIA:   local and general  EBL:  85 mL   BLOOD ADMINISTERED:none  DRAINS: none   LOCAL MEDICATIONS USED:  BUPIVICAINE   SPECIMEN:  No Specimen  DISPOSITION OF SPECIMEN:  N/A  COUNTS:  YES  TOURNIQUET:  * No tourniquets in log *  DICTATION: .Dragon Dictation Indication procedure: Patient is a 58 year old female who previously had undergone a laparoscopic Nissen fundoplication and hiatal hernia repair.  Subsequently patient was followed up and was found to have a recurrence with a large type IV hiatal hernia.  This was done at outside institution.  Patient was referred for redo hiatal hernia repair and fundoplication.  Findings: Patient had large amount of scar tissue, dense, with large amount of the stomach within the right chest.  It was difficult to assess scar tissue in the stomach.  The Nissen fundoplication was completely taken down.  The esophagus appeared to be short.  Please see Dr. Clyda Greener operative note, and EGD findings.  I believe this likely play a part in the recurrence.  There was one gastrotomy that was made and identified and repaired immediately with Lembert 3 oh silks.  Upon the completion EGD there were no leaks that could be seen.  Patient underwent a toupee fundoplication at the GE junction.  This was confirmed with completion EGD.  Details of procedure: After the patient was consented she was taken back to the operating placed in supine  position with bilateral SCDs in place.  She underwent general trach intubation.  Patient was then prepped and draped in fashion.  Time was called and all facts verified.  A Veress needle technique was used to insufflate the abdomen to 15 mm of mercury the paramedian stab incision. Subsequent to this an 8 mm trocar was introduced as was a 8 millimeter camera. At this time the subsequent robotic trochars x3, were then placed adjacent to this trocar approximately 8-10 cm away. Each trocar was inserted under direct visualization, there were total of 4 trochars. The assistant trocar was then placed in the right lower quadrant under direct visualization. The Nathanson retractor was then visualized inserted into the abdomen and the incision just to the left of the falciform ligament. This was then placed to retract the liver appropriately. At this time the patient was positioned in reverse Trendelenburg.   At this time the robot patient cart was brought to the bedside and placed in good position and the arms were docked to the trochars appropriately.  There is large amount of dense adhesions to the hiatus.  Large amount of stomach was herniated into the right chest.  There was no colonectomy visualized within the hernia.  At this time I proceeded to incised the gastrohepatic ligament.  At this time I proceeded to mobilize the stomach inferiorly and visualize the right crus. The hernia sac over the right crus was incised and right crus was identified.  I proceeded to incise the anterior hernia sac to help break  the suction cup affect.  There is large amount of adhesion of the stomach adherent to the left pleural space as well as right pleural space.  I mobilized the stomach inferiorly and and sizes both with the vessel sealer as well as sharply with scissors.  Upon mobilization of the previous wrap it.  There was a hole made into the stomach.  This was immediately identified.  The gastrotomy was then repaired using 3 oh  silks in a Lembert fashion x3.  Continue with mobilizing the stomach and the esophagus 360 degrees.  I was able to completely mobilize the esophagus and stomach inferiorly.  The mobilization was very meticulous secondary to the dense adhesions.  I proceeded dissection superiorly.  There was some untouched dissection that could be easily done at the most proximal portion of the esophagus.  This was mobilized this easily was able to bring the esophagus as well as the GE junction down to the abdominal cavity.  The wrap was completely undone.  This was helped by visualizing the previous stitches.  Once this was completely mobilized I was able to shoeshine the cardia of stomach behind the esophagus.  I was able to dissect and ligate the short gastric vessels using a LigaSure device.  At this time the hiatal defect was very large.  At this time I proceeded to close the hiatus using figure of 8 0 Ethibonds x 3. This brought together the hiatal closure without undue stricture to the esophagus.   A piece of Gore Bio A hiatal mesh was placed over the hiatal closure and sutured to the crus using 0 Ethibond x 4.  At this time the greater curvature was brought around the esophagus and sutured using 0 silk sutures interrupted fashion approximately 1 cm apart x3 on each side of the esophagus in a Toupet fashion. The wrap lay loose with no strangulation of the esophagus.  At this time Dr. Michaell Cowing proceeded with the completion EGD.  There appeared to be no perforation that could be seen within the esophagus for the stomach.  Please see Dr. Michaell Cowing this or note for full details.  At this time the robot was undocked. The liver trocar was removed. At this time insufflation was evacuated. Skin was reapproximated for Monocryl subcuticular fashion. The skin was then dressed with Dermabond. The patient tolerated the procedure well and was taken to the recovery room in stable condition.    PLAN OF CARE: Admit for overnight  observation  PATIENT DISPOSITION:  PACU - hemodynamically stable.   Delay start of Pharmacological VTE agent (>24hrs) due to surgical blood loss or risk of bleeding: not applicable

## 2019-05-19 NOTE — Anesthesia Procedure Notes (Addendum)
Procedure Name: Intubation Date/Time: 05/19/2019 11:54 AM Performed by: Eben Burow, CRNA Pre-anesthesia Checklist: Patient identified, Emergency Drugs available, Suction available, Patient being monitored and Timeout performed Patient Re-evaluated:Patient Re-evaluated prior to induction Oxygen Delivery Method: Circle system utilized Preoxygenation: Pre-oxygenation with 100% oxygen Induction Type: IV induction and Rapid sequence Laryngoscope Size: Mac and 4 Grade View: Grade I Tube type: Oral Tube size: 7.0 mm Number of attempts: 1 Airway Equipment and Method: Stylet Placement Confirmation: ETT inserted through vocal cords under direct vision,  positive ETCO2 and breath sounds checked- equal and bilateral Secured at: 21 cm Tube secured with: Tape Dental Injury: Teeth and Oropharynx as per pre-operative assessment

## 2019-05-20 ENCOUNTER — Inpatient Hospital Stay (HOSPITAL_COMMUNITY): Payer: Commercial Managed Care - PPO

## 2019-05-20 DIAGNOSIS — Z9889 Other specified postprocedural states: Secondary | ICD-10-CM

## 2019-05-20 DIAGNOSIS — K9189 Other postprocedural complications and disorders of digestive system: Secondary | ICD-10-CM

## 2019-05-20 LAB — BASIC METABOLIC PANEL
Anion gap: 9 (ref 5–15)
BUN: 8 mg/dL (ref 6–20)
CO2: 26 mmol/L (ref 22–32)
Calcium: 8.8 mg/dL — ABNORMAL LOW (ref 8.9–10.3)
Chloride: 103 mmol/L (ref 98–111)
Creatinine, Ser: 0.54 mg/dL (ref 0.44–1.00)
GFR calc Af Amer: >60 mL/min (ref >60)
GFR calc non Af Amer: >60 mL/min (ref >60)
Glucose, Bld: 175 mg/dL — ABNORMAL HIGH (ref 70–99)
Potassium: 4.3 mmol/L (ref 3.5–5.1)
Sodium: 138 mmol/L (ref 135–145)

## 2019-05-20 LAB — CBC
HCT: 36.6 % (ref 36.0–46.0)
Hemoglobin: 11.8 g/dL — ABNORMAL LOW (ref 12.0–15.0)
MCH: 29.6 pg (ref 26.0–34.0)
MCHC: 32.2 g/dL (ref 30.0–36.0)
MCV: 92 fL (ref 80.0–100.0)
Platelets: 131 10*3/uL — ABNORMAL LOW (ref 150–400)
RBC: 3.98 MIL/uL (ref 3.87–5.11)
RDW: 12.8 % (ref 11.5–15.5)
WBC: 7.1 10*3/uL (ref 4.0–10.5)
nRBC: 0 % (ref 0.0–0.2)

## 2019-05-20 LAB — GLUCOSE, CAPILLARY: Glucose-Capillary: 131 mg/dL — ABNORMAL HIGH (ref 70–99)

## 2019-05-20 MED ORDER — DEXAMETHASONE SODIUM PHOSPHATE 4 MG/ML IJ SOLN
4.0000 mg | Freq: Two times a day (BID) | INTRAMUSCULAR | Status: DC
  Administered 2019-05-20 – 2019-05-21 (×3): 4 mg via INTRAVENOUS
  Filled 2019-05-20 (×3): qty 1

## 2019-05-20 MED ORDER — LIP MEDEX EX OINT
1.0000 "application " | TOPICAL_OINTMENT | Freq: Two times a day (BID) | CUTANEOUS | Status: DC
  Administered 2019-05-20 – 2019-05-21 (×4): 1 via TOPICAL
  Filled 2019-05-20 (×3): qty 7

## 2019-05-20 MED ORDER — ONDANSETRON HCL 4 MG PO TABS
4.0000 mg | ORAL_TABLET | Freq: Three times a day (TID) | ORAL | Status: DC
  Administered 2019-05-20 – 2019-05-21 (×4): 4 mg via ORAL
  Filled 2019-05-20 (×3): qty 1

## 2019-05-20 MED ORDER — MENTHOL 3 MG MT LOZG: 1.0000 | LOZENGE | OROMUCOSAL | Status: DC | PRN

## 2019-05-20 MED ORDER — METHOCARBAMOL 1000 MG/10ML IJ SOLN
1000.0000 mg | Freq: Four times a day (QID) | INTRAVENOUS | Status: DC | PRN
  Filled 2019-05-20: qty 10

## 2019-05-20 MED ORDER — SCOPOLAMINE 1 MG/3DAYS TD PT72
1.0000 | MEDICATED_PATCH | TRANSDERMAL | Status: DC
  Administered 2019-05-20: 1.5 mg via TRANSDERMAL
  Filled 2019-05-20: qty 1

## 2019-05-20 MED ORDER — DIPHENHYDRAMINE HCL 50 MG/ML IJ SOLN: 12.5000 mg | Freq: Four times a day (QID) | INTRAMUSCULAR | Status: DC | PRN

## 2019-05-20 MED ORDER — SIMETHICONE 40 MG/0.6ML PO SUSP
40.0000 mg | Freq: Four times a day (QID) | ORAL | Status: DC | PRN
  Filled 2019-05-20: qty 0.6

## 2019-05-20 MED ORDER — PROCHLORPERAZINE EDISYLATE 10 MG/2ML IJ SOLN
5.0000 mg | INTRAMUSCULAR | Status: DC | PRN
  Administered 2019-05-20: 5 mg via INTRAVENOUS
  Filled 2019-05-20: qty 2

## 2019-05-20 MED ORDER — ALUM & MAG HYDROXIDE-SIMETH 200-200-20 MG/5ML PO SUSP: 30.0000 mL | Freq: Four times a day (QID) | ORAL | Status: DC | PRN

## 2019-05-20 MED ORDER — BISACODYL 10 MG RE SUPP: 10.0000 mg | Freq: Two times a day (BID) | RECTAL | Status: DC | PRN

## 2019-05-20 MED ORDER — LACTATED RINGERS IV BOLUS: 1000.0000 mL | Freq: Three times a day (TID) | INTRAVENOUS | Status: DC | PRN

## 2019-05-20 MED ORDER — METOCLOPRAMIDE HCL 5 MG/ML IJ SOLN: 5.0000 mg | Freq: Three times a day (TID) | INTRAMUSCULAR | Status: DC | PRN

## 2019-05-20 MED ORDER — CHLORHEXIDINE GLUCONATE CLOTH 2 % EX PADS
6.0000 | MEDICATED_PAD | Freq: Every day | CUTANEOUS | Status: DC
  Administered 2019-05-20 – 2019-05-21 (×2): 6 via TOPICAL

## 2019-05-20 MED ORDER — ACETAMINOPHEN 650 MG RE SUPP: 650.0000 mg | Freq: Four times a day (QID) | RECTAL | Status: DC | PRN

## 2019-05-20 MED ORDER — MAGIC MOUTHWASH
15.0000 mL | Freq: Four times a day (QID) | ORAL | Status: DC | PRN
  Filled 2019-05-20: qty 15

## 2019-05-20 MED ORDER — PHENOL 1.4 % MT LIQD
2.0000 | OROMUCOSAL | Status: DC | PRN
  Filled 2019-05-20: qty 177

## 2019-05-20 NOTE — Progress Notes (Signed)
Radiology Progress Note  Patient brought down for esophagram and could not tolerate drinking contrast. Immediately started to vomit and wretch continuously with only one sip of water soluble contrast. Will not be able to perform diagnostic study and patient returned to room.  Venetia Night. Kathlene Cote, M.D Pager:  [phone]

## 2019-05-20 NOTE — Progress Notes (Signed)
Notified physician on call that patient was unable to tolerate po contrast so esophagram could not be completed. Per radiology, will try again in am.

## 2019-05-20 NOTE — Plan of Care (Signed)
Patient lying in bed this morning; pain controlled but continues to complain of nausea. No needs expressed. Will continue to monitor.

## 2019-05-20 NOTE — Plan of Care (Signed)
  Problem: Clinical Measurements: Goal: Postoperative complications will be avoided or minimized Outcome: Progressing   Problem: Skin Integrity: Goal: Demonstration of wound healing without infection will improve Outcome: Progressing   Problem: Education: Goal: Knowledge of General Education information will improve Description: Including pain rating scale, medication(s)/side effects and non-pharmacologic comfort measures Outcome: Progressing   Problem: Health Behavior/Discharge Planning: Goal: Ability to manage health-related needs will improve Outcome: Progressing   Problem: Clinical Measurements: Goal: Ability to maintain clinical measurements within normal limits will improve Outcome: Progressing Goal: Will remain free from infection Outcome: Progressing Goal: Diagnostic test results will improve Outcome: Progressing Goal: Respiratory complications will improve Outcome: Progressing Goal: Cardiovascular complication will be avoided Outcome: Progressing   Problem: Activity: Goal: Risk for activity intolerance will decrease Outcome: Progressing   Problem: Nutrition: Goal: Adequate nutrition will be maintained Outcome: Progressing   Problem: Coping: Goal: Level of anxiety will decrease Outcome: Progressing   Problem: Elimination: Goal: Will not experience complications related to bowel motility Outcome: Progressing   Problem: Pain Managment: Goal: General experience of comfort will improve Outcome: Progressing   Problem: Safety: Goal: Ability to remain free from injury will improve Outcome: Progressing

## 2019-05-20 NOTE — Progress Notes (Signed)
Karina Gomez [id number] 03/24/1961  CARE TEAM:  PCP: Eloisa Northern, MD  Outpatient Care Team: Patient Care Team: Eloisa Northern, MD as PCP - General (Internal Medicine) Axel Filler, MD as Consulting Physician (General Surgery)  Inpatient Treatment Team: Treatment Team: Attending Provider: Axel Filler, MD; Technician: Marrian Salvage, NT; Registered Nurse: Quentin Cornwall, RN   Problem List:   Principal Problem:   Paraesophageal hernia, recurrent, s/p robotic re-repair 05/19/2019 Active Problems:   Slipped Nissen fundoplication s/p robotic takedown & Toupet partial findoplication 05/19/2019   1 Day Post-Op  05/19/2019  POST-OPERATIVE DIAGNOSIS:  RECURRENT HIATAL HERNIA TYPE IV  PROCEDURE:  XI ROBOTIC ASSISTED REDO HIATAL HERNIA REPAIR WITH MESH TOUPET FUNDOPLICATION   SURGEON:  Axel Filler, MD - Primary    Assessment  Gradually recovering  [facility] Stay = 1 days)  Plan:  -Nausea control Pain control Gastrografin swallow.  If no evidence of leak or obstruction, advance to dysphagia 1/pured diet as tolerated. -VTE prophylaxis- SCDs, etc -mobilize as tolerated to help recovery  20 minutes spent in review, evaluation, examination, counseling, and coordination of care.  More than 50% of that time was spent in counseling.  05/20/2019    Subjective: (Chief complaint)  Nauseated but not to the point of retching.  Pain most controlled.    Objective:  Vital signs:  Vitals:   05/19/19 2040 05/19/19 2136 05/20/19 0150 05/20/19 0538  BP: (!) 141/83 124/75 136/73 120/66  Pulse: 70 76 79 74  Resp: 18 18 18 18   Temp: 97.9 F (36.6 C) 97.6 F (36.4 C) (!) 97.4 F (36.3 C) (!) 97.5 F (36.4 C)  TempSrc: Oral Oral Oral Oral  SpO2: 100% 99% 93% 98%  Weight:      Height:           Intake/Output   Yesterday:  12/18 0701 - 12/19 0700 In: 4186.8 [P.O.:180; I.V.:3806.8; IV Piggyback:200] Out: 2790 [Urine:2705; Blood:85] This  shift:  No intake/output data recorded.  Bowel function:  Flatus: YES  BM:  No  Drain: (No drain)   Physical Exam:  General: Pt awake/alert/oriented x4 in no acute distress Eyes: PERRL, normal EOM.  Sclera clear.  No icterus Neuro: CN II-XII intact w/o focal sensory/motor deficits. Lymph: No head/neck/groin lymphadenopathy Psych:  No delerium/psychosis/paranoia.  Mildly anxious but consolable HENT: Normocephalic, Mucus membranes moist.  No thrush Neck: Supple, No tracheal deviation Chest: No chest wall pain w good excursion CV:  Pulses intact.  Regular rhythm MS: Normal AROM mjr joints.  No obvious deformity  Abdomen: Soft.  Mildy distended.  Mildly tender at incisions only.  No evidence of peritonitis.  No incarcerated hernias.  Ext:  No deformity.  No mjr edema.  No cyanosis Skin: No petechiae / purpura  Results:   Cultures: Recent Results (from the past 720 hour(s))  Novel Coronavirus, NAA (Hosp order, Send-out to Ref Lab; TAT 18-24 hrs     Status: None   Collection Time: 05/16/19  9:02 AM   Specimen: Nasopharyngeal Swab; Respiratory  Result Value Ref Range Status   SARS-CoV-2, NAA NOT DETECTED NOT DETECTED Final    Comment: (NOTE) Testing was performed using the cobas(R) SARS-CoV-2 test. This nucleic acid amplification test was developed and its performance characteristics determined by 05/18/19. Nucleic acid amplification tests include PCR and TMA. This test has not been FDA cleared or approved. This test has been authorized by FDA under an Emergency Use Authorization (EUA). This test is only authorized for the duration of time the declaration  that circumstances exist justifying the authorization of the emergency use of in vitro diagnostic tests for detection of SARS-CoV-2 virus and/or diagnosis of COVID-19 infection under section 564(b)(1) of the Act, 21 U.S.C. 433IRJ-1(O) (1), unless the authorization is terminated or revoked sooner. When diagnostic  testing is negative, the possibility of a false negative result should be considered in the context of a patient's recent exposures and the presence of clinical signs and symptoms consistent with COVID-19. An individual without s ymptoms of COVID- 19 and who is not shedding SARS-CoV-2 virus would expect to have a negative (not detected) result in this assay. Performed At: [facility] [address] [place], [place] [id number] Rush Farmer MD ZS:[id number]    [place]  Final    Comment: Performed at [facility], [place] [address], [place], [place] [address]    Labs: Results for orders placed or performed during the hospital encounter of 05/19/19 (from the past 48 hour(s))  Basic metabolic panel     Status: Abnormal   Collection Time: 05/20/19  4:50 AM  Result Value Ref Range   Sodium 138 135 - 145 mmol/L   Potassium 4.3 3.5 - 5.1 mmol/L   Chloride 103 98 - 111 mmol/L   CO2 26 22 - 32 mmol/L   Glucose, Bld 175 (H) 70 - 99 mg/dL   BUN 8 6 - 20 mg/dL   Creatinine, Ser 0.54 0.44 - 1.00 mg/dL   Calcium 8.8 (L) 8.9 - 10.3 mg/dL   GFR calc non Af Amer >60 >60 mL/min   GFR calc Af Amer >60 >60 mL/min   Anion gap 9 5 - 15    Comment: Performed at [facility], [place] [address] [place], [place] [address]  CBC     Status: Abnormal   Collection Time: 05/20/19  4:50 AM  Result Value Ref Range   WBC 7.1 4.0 - 10.5 K/uL   RBC 3.98 3.87 - 5.11 MIL/uL   Hemoglobin 11.8 (L) 12.0 - 15.0 g/dL   HCT 36.6 36.0 - 46.0 %   MCV 92.0 80.0 - 100.0 fL   MCH 29.6 26.0 - 34.0 pg   MCHC 32.2 30.0 - 36.0 g/dL   RDW 12.8 11.5 - 15.5 %   Platelets 131 (L) 150 - 400 K/uL   nRBC 0.0 0.0 - 0.2 %    Comment: Performed at [facility], [place] [address] [place], [place] [address]  Glucose, capillary     Status: Abnormal   Collection Time: 05/20/19  7:22 AM  Result Value Ref Range   Glucose-Capillary 131 (H) 70 - 99 mg/dL    Imaging  / Studies: No results found.  Medications / Allergies: per chart  Antibiotics: Anti-infectives (From admission, onward)   Start     Dose/Rate Route Frequency Ordered Stop   05/19/19 1015  ceFAZolin (ANCEF) IVPB 2g/100 mL premix     2 g 200 mL/hr over 30 Minutes Intravenous On call to O.R. 05/19/19 1004 05/19/19 1553        Note: Portions of this report may have been transcribed using voice recognition software. Every effort was made to ensure accuracy; however, inadvertent computerized transcription errors may be present.   Any transcriptional errors that result from this process are unintentional.     Adin Hector, MD, FACS, MASCRS Gastrointestinal and Minimally Invasive Surgery    [address] [place], [place] [address] [phone] Main / Paging [phone] Fax

## 2019-05-21 ENCOUNTER — Inpatient Hospital Stay (HOSPITAL_COMMUNITY): Payer: Commercial Managed Care - PPO

## 2019-05-21 MED ORDER — ONDANSETRON HCL 4 MG PO TABS: 4.0000 mg | ORAL_TABLET | Freq: Three times a day (TID) | ORAL | 5 refills | Status: AC | PRN

## 2019-05-21 MED ORDER — LACTATED RINGERS IV BOLUS: 1000.0000 mL | Freq: Three times a day (TID) | INTRAVENOUS | Status: DC | PRN

## 2019-05-21 MED ORDER — TRAMADOL HCL 50 MG PO TABS: 50.0000 mg | ORAL_TABLET | Freq: Four times a day (QID) | ORAL | 0 refills | Status: DC | PRN

## 2019-05-21 MED ORDER — SODIUM CHLORIDE 0.9% FLUSH: 3.0000 mL | INTRAVENOUS | Status: DC | PRN

## 2019-05-21 MED ORDER — SODIUM CHLORIDE 0.9% FLUSH
3.0000 mL | Freq: Two times a day (BID) | INTRAVENOUS | Status: DC
  Administered 2019-05-21: 3 mL via INTRAVENOUS

## 2019-05-21 MED ORDER — SODIUM CHLORIDE 0.9 % IV SOLN: 250.0000 mL | INTRAVENOUS | Status: DC | PRN

## 2019-05-21 NOTE — Progress Notes (Signed)
Nursing told by radiology department that study would be delayed till tomorrow.  Patient feeling much better and wishing to try and get the study day so she can potentially go home.  I called discussed with Dr. Kathlene Cote who had stated yesterday that they would attempt to do esophagram this morning.  He is no longer on-call but recommended I discussed with the person on fluoroscopy today..  I then called with Dr. Lin Landsman.  He will see if they can try and get it done today since patient feeling much better in the hopes that her discharge will not be delayed.  Adin Hector, MD, FACS, MASCRS Gastrointestinal and Minimally Invasive Surgery  [facility] [address] [place], [place] [address] [phone] Main / Paging [phone] Fax

## 2019-05-21 NOTE — Progress Notes (Signed)
Karina Gomez [id number] 09-09-1960  CARE TEAM:  PCP: Eloisa Northern, MD  Outpatient Care Team: Patient Care Team: Eloisa Northern, MD as PCP - General (Internal Medicine) Axel Filler, MD as Consulting Physician (General Surgery)  Inpatient Treatment Team: Treatment Team: Attending Provider: Axel Filler, MD; Technician: Marrian Salvage, NT; Registered Nurse: Demetrios Loll, RN; Registered Nurse: Amil Amen, RN   Problem List:   Principal Problem:   Paraesophageal hernia, recurrent, s/p robotic re-repair 05/19/2019 Active Problems:   Slipped Nissen fundoplication s/p robotic takedown & Toupet partial findoplication 05/19/2019   2 Days Post-Op  05/19/2019  POST-OPERATIVE DIAGNOSIS:  RECURRENT HIATAL HERNIA TYPE IV  PROCEDURE:  XI ROBOTIC ASSISTED REDO HIATAL HERNIA REPAIR WITH MESH TOUPET FUNDOPLICATION   SURGEON:  Axel Filler, MD - Primary    Assessment  Gradually recovering  [facility] Stay = 2 days)  Plan:  -Nausea control Pain control  Apparently patient too nauseated to tolerate Gastrografin swallow yesterday morning.  She feels much better now.  Dr. Fredia Sorrow from radiology noted that they would attempt repeating it this morning.  He does not happened yet.. I no longer see the order for certain so I reordered it.  If no evidence of leak or obstruction, advance to dysphagia 1/pured diet as tolerated.  -VTE prophylaxis- SCDs, etc -mobilize as tolerated to help recovery  D/C patient from hospital when patient meets criteria (anticipate in 0-1 day(s)):  Tolerating oral intake well Ambulating well Adequate pain control without IV medications Urinating  Having flatus Disposition planning in place   20 minutes spent in review, evaluation, examination, counseling, and coordination of care.  More than 50% of that time was spent in counseling.  05/21/2019    Subjective: (Chief complaint)  Nauseated with esophagram yesterday.  Feels much  better.  Tolerated some sips.  Hoping to go home soon.      Objective:  Vital signs:  Vitals:   05/20/19 0958 05/20/19 1410 05/20/19 1738 05/20/19 2100  BP: (!) 117/56 122/65 125/69 131/80  Pulse: 75 73 78 74  Resp: 18 18 16 17   Temp: 97.9 F (36.6 C) 98.3 F (36.8 C) 98.1 F (36.7 C) 98 F (36.7 C)  TempSrc: Oral Oral Oral Oral  SpO2: 96% 96% 94% 96%  Weight:      Height:        Last BM Date: 05/19/19  Intake/Output   Yesterday:  12/19 0701 - 12/20 0700 In: 2462.5 [P.O.:680; I.V.:1782.5] Out: 1775 [Urine:1775] This shift:  Total I/O In: 545.2 [P.O.:240; I.V.:305.2] Out: 100 [Urine:100]  Bowel function:  Flatus: YES  BM:  No  Drain: (No drain)   Physical Exam:  General: Pt awake/alert/oriented x4 in no acute distress Eyes: PERRL, normal EOM.  Sclera clear.  No icterus Neuro: CN II-XII intact w/o focal sensory/motor deficits. Lymph: No head/neck/groin lymphadenopathy Psych:  No delerium/psychosis/paranoia.  Mildly anxious but consolable HENT: Normocephalic, Mucus membranes moist.  No thrush Neck: Supple, No tracheal deviation Chest: No chest wall pain w good excursion CV:  Pulses intact.  Regular rhythm MS: Normal AROM mjr joints.  No obvious deformity  Abdomen: Soft.  Nondistended.  Mildly tender at incisions only.  No evidence of peritonitis.  No incarcerated hernias.  Ext:  No deformity.  No mjr edema.  No cyanosis Skin: No petechiae / purpura  Results:   Cultures: Recent Results (from the past 720 hour(s))  Novel Coronavirus, NAA (Hosp order, Send-out to Ref Lab; TAT 18-24 hrs     Status: None  Collection Time: 05/16/19  9:02 AM   Specimen: Nasopharyngeal Swab; Respiratory  Result Value Ref Range Status   SARS-CoV-2, NAA NOT DETECTED NOT DETECTED Final    Comment: (NOTE) Testing was performed using the cobas(R) SARS-CoV-2 test. This nucleic acid amplification test was developed and its performance characteristics determined by [facility]. Nucleic acid amplification tests include PCR and TMA. This test has not been FDA cleared or approved. This test has been authorized by FDA under an Emergency Use Authorization (EUA). This test is only authorized for the duration of time the declaration that circumstances exist justifying the authorization of the emergency use of in vitro diagnostic tests for detection of SARS-CoV-2 virus and/or diagnosis of COVID-19 infection under section 564(b)(1) of the Act, 21 U.S.C. 161WRU-0(A360bbb-3(b) (1), unless the authorization is terminated or revoked sooner. When diagnostic testing is negative, the possibility of a false negative result should be considered in the context of a patient's recent exposures and the presence of clinical signs and symptoms consistent with COVID-19. An individual without s ymptoms of COVID- 19 and who is not shedding SARS-CoV-2 virus would expect to have a negative (not detected) result in this assay. Performed At: [facility] [place] [address] [place], [place] [id number] Jolene SchimkeNagendra Sanjai MD YN:[phone]    Coronavirus Source NASOPHARYNGEAL  Final    Comment: Performed at [facility], [address] [place], [place] [address]    Labs: Results for orders placed or performed during the hospital encounter of 05/19/19 (from the past 48 hour(s))  Basic metabolic panel     Status: Abnormal   Collection Time: 05/20/19  4:50 AM  Result Value Ref Range   Sodium 138 135 - 145 mmol/L   Potassium 4.3 3.5 - 5.1 mmol/L   Chloride 103 98 - 111 mmol/L   CO2 26 22 - 32 mmol/L   Glucose, Bld 175 (H) 70 - 99 mg/dL   BUN 8 6 - 20 mg/dL   Creatinine, Ser 6.960.54 0.44 - 1.00 mg/dL   Calcium 8.8 (L) 8.9 - 10.3 mg/dL   GFR calc non Af Amer >60 >60 mL/min   GFR calc Af Amer >60 >60 mL/min   Anion gap 9 5 - 15    Comment: Performed at [facility], [address] [place], [place] [address]  CBC     Status: Abnormal   Collection Time: 05/20/19   4:50 AM  Result Value Ref Range   WBC 7.1 4.0 - 10.5 K/uL   RBC 3.98 3.87 - 5.11 MIL/uL   Hemoglobin 11.8 (L) 12.0 - 15.0 g/dL   HCT 41.336.6 24.436.0 - 01.046.0 %   MCV 92.0 80.0 - 100.0 fL   MCH 29.6 26.0 - 34.0 pg   MCHC 32.2 30.0 - 36.0 g/dL   RDW 27.212.8 53.611.5 - 64.415.5 %   Platelets 131 (L) 150 - 400 K/uL   nRBC 0.0 0.0 - 0.2 %    Comment: Performed at [facility], [address] [place], [place] [address]  Glucose, capillary     Status: Abnormal   Collection Time: 05/20/19  7:22 AM  Result Value Ref Range   Glucose-Capillary 131 (H) 70 - 99 mg/dL    Imaging / Studies: DG Fluoro Rm 1-60 Min - No Report  Result Date: 05/20/2019 Fluoroscopy was utilized by the requesting physician.  No radiographic interpretation.    Medications / Allergies: per chart  Antibiotics: Anti-infectives (From admission, onward)   Start  Dose/Rate Route Frequency Ordered Stop   05/19/19 1015  ceFAZolin (ANCEF) IVPB 2g/100 mL premix     2 g 200 mL/hr over 30 Minutes Intravenous On call to O.R. 05/19/19 1004 05/19/19 1553        Note: Portions of this report may have been transcribed using voice recognition software. Every effort was made to ensure accuracy; however, inadvertent computerized transcription errors may be present.   Any transcriptional errors that result from this process are unintentional.     Adin Hector, MD, FACS, MASCRS Gastrointestinal and Minimally Invasive Surgery    [address] [place], [place] [address] [phone] Main / Paging [phone] Fax

## 2019-05-21 NOTE — Plan of Care (Signed)
Pt was discharged home today. Instructions were reviewed with patient, and questions were answered. Pt was taken to main entrance via wheelchair by NT.  

## 2019-05-21 NOTE — Discharge Instructions (Signed)
EATING AFTER YOUR ESOPHAGEAL SURGERY (Stomach Fundoplication, Hiatal Hernia repair, Achalasia surgery, etc)  ######################################################################  EAT Start with a pureed / full liquid diet (see below) Gradually transition to a high fiber diet with a fiber supplement over the next month after discharge.    WALK Walk an hour a day.  Control your pain to do that.    CONTROL PAIN Control pain so that you can walk, sleep, tolerate sneezing/coughing, go up/down stairs.  HAVE A BOWEL MOVEMENT DAILY Keep your bowels regular to avoid problems.  OK to try a laxative to override constipation.  OK to use an antidairrheal to slow down diarrhea.  Call if not better after 2 tries  CALL IF YOU HAVE PROBLEMS/CONCERNS Call if you are still struggling despite following these instructions. Call if you have concerns not answered by these instructions  ######################################################################   After your esophageal surgery, expect some sticking with swallowing over the next 1-2 months.    If food sticks when you eat, it is called "dysphagia".  This is due to swelling around your esophagus at the wrap & hiatal diaphragm repair.  It will gradually ease off over the next few months.  To help you through this temporary phase, we start you out on a pureed (blenderized) diet.  Your first meal in the hospital was thin liquids.  You should have been given a pureed diet by the time you left the hospital.  We ask patients to stay on a pureed diet for the first 2-3 weeks to avoid anything getting "stuck" near your recent surgery.  Don't be alarmed if your ability to swallow doesn't progress according to this plan.  Everyone is different and some diets can advance more or less quickly.    It is often helpful to crush your medications or split them as they can sometimes stick, especially the first week or so.   Some BASIC RULES to follow  are:  Maintain an upright position whenever eating or drinking.  Take small bites - just a teaspoon size bite at a time.  Eat slowly.  It may also help to eat only one food at a time.  Consider nibbling through smaller, more frequent meals & avoid the urge to eat BIG meals  Do not push through feelings of fullness, nausea, or bloatedness  Do not mix solid foods and liquids in the same mouthful  Try not to "wash foods down" with large gulps of liquids.  Avoid carbonated (bubbly/fizzy) drinks.    Avoid foods that make you feel gassy or bloated.  Start with bland foods first.  Wait on trying greasy, fried, or spicy meals until you are tolerating more bland solids well.  Understand that it will be hard to burp and belch at first.  This gradually improves with time.  Expect to be more gassy/flatulent/bloated initially.  Walking will help your body manage it better.  Consider using medications for bloating that contain simethicone such as  Maalox or Gas-X   Consider crushing her medications, especially smaller pills.  The ability to swallow pills should get easier after a few weeks  Eat in a relaxed atmosphere & minimize distractions.  Avoid talking while eating.    Do not use straws.  Following each meal, sit in an upright position (90 degree angle) for 60 to 90 minutes.  Going for a short walk can help as well  If food does stick, don't panic.  Try to relax and let the food pass on its own.    Sipping WARM LIQUID such as strong hot black tea can also help slide it down.   Be gradual in changes & use common sense:  -If you easily tolerating a certain "level" of foods, advance to the next level gradually -If you are having trouble swallowing a particular food, then avoid it.   -If food is sticking when you advance your diet, go back to thinner previous diet (the lower LEVEL) for 1-2 days.  LEVEL 1 = PUREED DIET  Do for the first 2 WEEKS AFTER SURGERY  -Foods in this group are  pureed or blenderized to a smooth, mashed potato-like consistency.  -If necessary, the pureed foods can keep their shape with the addition of a thickening agent.   -Meat should be pureed to a smooth, pasty consistency.  Hot broth or gravy may be added to the pureed meat, approximately 1 oz. of liquid per 3 oz. serving of meat. -CAUTION:  If any foods do not puree into a smooth consistency, swallowing will be more difficult.  (For example, nuts or seeds sometimes do not blend well.)  Hot Foods Cold Foods  Pureed scrambled eggs and cheese Pureed cottage cheese  Baby cereals Thickened juices and nectars  Thinned cooked cereals (no lumps) Thickened milk or eggnog  Pureed French toast or pancakes Ensure  Mashed potatoes Ice cream  Pureed parsley, au gratin, scalloped potatoes, candied sweet potatoes Fruit or Italian ice, sherbet  Pureed buttered or alfredo noodles Plain yogurt  Pureed vegetables (no corn or peas) Instant breakfast  Pureed soups and creamed soups Smooth pudding, mousse, custard  Pureed scalloped apples Whipped gelatin  Gravies Sugar, syrup, honey, jelly  Sauces, cheese, tomato, barbecue, white, creamed Cream  Any baby food Creamer  Alcohol in moderation (not beer or champagne) Margarine  Coffee or tea Mayonnaise   Ketchup, mustard   Apple sauce   SAMPLE MENU:  PUREED DIET Breakfast Lunch Dinner   Orange juice, 1/2 cup  Cream of wheat, 1/2 cup  Pineapple juice, 1/2 cup  Pureed turkey, barley soup, 3/4 cup  Pureed Hawaiian chicken, 3 oz   Scrambled eggs, mashed or blended with cheese, 1/2 cup  Tea or coffee, 1 cup   Whole milk, 1 cup   Non-dairy creamer, 2 Tbsp.  Mashed potatoes, 1/2 cup  Pureed cooled broccoli, 1/2 cup  Apple sauce, 1/2 cup  Coffee or tea  Mashed potatoes, 1/2 cup  Pureed spinach, 1/2 cup  Frozen yogurt, 1/2 cup  Tea or coffee      LEVEL 2 = SOFT DIET  After your first 2 weeks, you can advance to a soft diet.   Keep on this  diet until everything goes down easily.  Hot Foods Cold Foods  White fish Cottage cheese  Stuffed fish Junior baby fruit  Baby food meals Semi thickened juices  Minced soft cooked, scrambled, poached eggs nectars  Souffle & omelets Ripe mashed bananas  Cooked cereals Canned fruit, pineapple sauce, milk  potatoes Milkshake  Buttered or Alfredo noodles Custard  Cooked cooled vegetable Puddings, including tapioca  Sherbet Yogurt  Vegetable soup or alphabet soup Fruit ice, Italian ice  Gravies Whipped gelatin  Sugar, syrup, honey, jelly Junior baby desserts  Sauces:  Cheese, creamed, barbecue, tomato, white Cream  Coffee or tea Margarine   SAMPLE MENU:  LEVEL 2 Breakfast Lunch Dinner   Orange juice, 1/2 cup  Oatmeal, 1/2 cup  Scrambled eggs with cheese, 1/2 cup  Decaffeinated tea, 1 cup  Whole milk, 1 cup    Non-dairy creamer, 2 Tbsp  Pineapple juice, 1/2 cup  Minced beef, 3 oz  Gravy, 2 Tbsp  Mashed potatoes, 1/2 cup  Minced fresh broccoli, 1/2 cup  Applesauce, 1/2 cup  Coffee, 1 cup  Turkey, barley soup, 3/4 cup  Minced Hawaiian chicken, 3 oz  Mashed potatoes, 1/2 cup  Cooked spinach, 1/2 cup  Frozen yogurt, 1/2 cup  Non-dairy creamer, 2 Tbsp      LEVEL 3 = CHOPPED DIET  -After all the foods in level 2 (soft diet) are passing through well you should advance up to more chopped foods.  -It is still important to cut these foods into small pieces and eat slowly.  Hot Foods Cold Foods  Poultry Cottage cheese  Chopped Swedish meatballs Yogurt  Meat salads (ground or flaked meat) Milk  Flaked fish (tuna) Milkshakes  Poached or scrambled eggs Soft, cold, dry cereal  Souffles and omelets Fruit juices or nectars  Cooked cereals Chopped canned fruit  Chopped French toast or pancakes Canned fruit cocktail  Noodles or pasta (no rice) Pudding, mousse, custard  Cooked vegetables (no frozen peas, corn, or mixed vegetables) Green salad  Canned small sweet peas  Ice cream  Creamed soup or vegetable soup Fruit ice, Italian ice  Pureed vegetable soup or alphabet soup Non-dairy creamer  Ground scalloped apples Margarine  Gravies Mayonnaise  Sauces:  Cheese, creamed, barbecue, tomato, white Ketchup  Coffee or tea Mustard   SAMPLE MENU:  LEVEL 3 Breakfast Lunch Dinner   Orange juice, 1/2 cup  Oatmeal, 1/2 cup  Scrambled eggs with cheese, 1/2 cup  Decaffeinated tea, 1 cup  Whole milk, 1 cup  Non-dairy creamer, 2 Tbsp  Ketchup, 1 Tbsp  Margarine, 1 tsp  Salt, 1/4 tsp  Sugar, 2 tsp  Pineapple juice, 1/2 cup  Ground beef, 3 oz  Gravy, 2 Tbsp  Mashed potatoes, 1/2 cup  Cooked spinach, 1/2 cup  Applesauce, 1/2 cup  Decaffeinated coffee  Whole milk  Non-dairy creamer, 2 Tbsp  Margarine, 1 tsp  Salt, 1/4 tsp  Pureed turkey, barley soup, 3/4 cup  Barbecue chicken, 3 oz  Mashed potatoes, 1/2 cup  Ground fresh broccoli, 1/2 cup  Frozen yogurt, 1/2 cup  Decaffeinated tea, 1 cup  Non-dairy creamer, 2 Tbsp  Margarine, 1 tsp  Salt, 1/4 tsp  Sugar, 1 tsp    LEVEL 4:  REGULAR FOODS  -Foods in this group are soft, moist, regularly textured foods.   -This level includes meat and breads, which tend to be the hardest things to swallow.   -Eat very slowly, chew well and continue to avoid carbonated drinks. -most people are at this level in 4-6 weeks  Hot Foods Cold Foods  Baked fish or skinned Soft cheeses - cottage cheese  Souffles and omelets Cream cheese  Eggs Yogurt  Stuffed shells Milk  Spaghetti with meat sauce Milkshakes  Cooked cereal Cold dry cereals (no nuts, dried fruit, coconut)  French toast or pancakes Crackers  Buttered toast Fruit juices or nectars  Noodles or pasta (no rice) Canned fruit  Potatoes (all types) Ripe bananas  Soft, cooked vegetables (no corn, lima, or baked beans) Peeled, ripe, fresh fruit  Creamed soups or vegetable soup Cakes (no nuts, dried fruit, coconut)  Canned chicken  noodle soup Plain doughnuts  Gravies Ice cream  Bacon dressing Pudding, mousse, custard  Sauces:  Cheese, creamed, barbecue, tomato, white Fruit ice, Italian ice, sherbet  Decaffeinated tea or coffee Whipped gelatin  Pork chops Regular gelatin     Canned fruited gelatin molds   Sugar, syrup, honey, jam, jelly   Cream   Non-dairy   Margarine   Oil   Mayonnaise   Ketchup   Mustard   TROUBLESHOOTING IRREGULAR BOWELS  1) Avoid extremes of bowel movements (no bad constipation/diarrhea)  2) Miralax 17gm mixed in 8oz. water or juice-daily. May use BID as needed.  3) Gas-x,Phazyme, etc. as needed for gas & bloating.  4) Soft,bland diet. No spicy,greasy,fried foods.  5) Prilosec over-the-counter as needed  6) May hold gluten/wheat products from diet to see if symptoms improve.  7) May try probiotics (Align, Activa, etc) to help calm the bowels down  7) If symptoms become worse call back immediately.    If you have any questions please call our office at [facility]: [phone].   LAPAROSCOPIC SURGERY: POST OP INSTRUCTIONS  ######################################################################  EAT Gradually transition to a high fiber diet with a fiber supplement over the next few weeks after discharge.  Start with a pureed / full liquid diet (see below)  WALK Walk an hour a day.  Control your pain to do that.    CONTROL PAIN Control pain so that you can walk, sleep, tolerate sneezing/coughing, go up/down stairs.  HAVE A BOWEL MOVEMENT DAILY Keep your bowels regular to avoid problems.  OK to try a laxative to override constipation.  OK to use an antidairrheal to slow down diarrhea.  Call if not better after 2 tries  CALL IF YOU HAVE PROBLEMS/CONCERNS Call if you are still struggling despite following these instructions. Call if you have concerns not answered by these  instructions  ######################################################################    1. DIET: Follow a light bland diet & liquids the first 24 hours after arrival home, such as soup, liquids, starches, etc.  Be sure to drink plenty of fluids.  Quickly advance to a usual solid diet within a few days.  Avoid fast food or heavy meals as your are more likely to get nauseated or have irregular bowels.  A low-fat, high-fiber diet for the rest of your life is ideal.  2. Take your usually prescribed home medications unless otherwise directed.  3. PAIN CONTROL: a. Pain is best controlled by a usual combination of three different methods TOGETHER: i. Ice/Heat ii. Over the counter pain medication iii. Prescription pain medication b. Most patients will experience some swelling and bruising around the incisions.  Ice packs or heating pads (30-60 minutes up to 6 times a day) will help. Use ice for the first few days to help decrease swelling and bruising, then switch to heat to help relax tight/sore spots and speed recovery.  Some people prefer to use ice alone, heat alone, alternating between ice & heat.  Experiment to what works for you.  Swelling and bruising can take several weeks to resolve.   c. It is helpful to take an over-the-counter pain medication regularly for the first few weeks.  Choose one of the following that works best for you: i. Naproxen (Aleve, etc)  Two 220mg tabs twice a day ii. Ibuprofen (Advil, etc) Three 200mg tabs four times a day (every meal & bedtime) iii. Acetaminophen (Tylenol, etc) 500-650mg four times a day (every meal & bedtime) d. A  prescription for pain medication (such as oxycodone, hydrocodone, tramadol, gabapentin, methocarbamol, etc) should be given to you upon discharge.  Take your pain medication as prescribed.  i. If you are having problems/concerns with the prescription medicine (does not control pain, nausea, vomiting, rash, itching, etc), please   call us [phone]  [phone] to see if we need to switch you to a different pain medicine that will work better for you and/or control your side effect better. ii. If you need a refill on your pain medication, please give us 48 hour notice.  contact your pharmacy.  They will contact our office to request authorization. Prescriptions will not be filled after 5 pm or on week-ends  4. Avoid getting constipated.   a. Between the surgery and the pain medications, it is common to experience some constipation.   b. Increasing fluid intake and taking a fiber supplement (such as Metamucil, Citrucel, FiberCon, MiraLax, etc) 1-2 times a day regularly will usually help prevent this problem from occurring.   c. A mild laxative (prune juice, Milk of Magnesia, MiraLax, etc) should be taken according to package directions if there are no bowel movements after 48 hours.   5. Watch out for diarrhea.   a. If you have many loose bowel movements, simplify your diet to bland foods & liquids for a few days.   b. Stop any stool softeners and decrease your fiber supplement.   c. Switching to mild anti-diarrheal medications (Kayopectate, Pepto Bismol) can help.   d. If this worsens or does not improve, please call us.  6. Wash / shower every day.  You may shower over the dressings as they are waterproof.  Continue to shower over incision(s) after the dressing is off.  7. Remove your waterproof bandages 5 days after surgery.  You may leave the incision open to air.  You may replace a dressing/Band-Aid to cover the incision for comfort if you wish.   8. ACTIVITIES as tolerated:   a. You may resume regular (light) daily activities beginning the next day--such as daily self-care, walking, climbing stairs--gradually increasing activities as tolerated.  If you can walk 30 minutes without difficulty, it is safe to try more intense activity such as jogging, treadmill, bicycling, low-impact aerobics, swimming, etc. b. Save the most intensive and  strenuous activity for last such as sit-ups, heavy lifting, contact sports, etc  Refrain from any heavy lifting or straining until you are off narcotics for pain control.   c. DO NOT PUSH THROUGH PAIN.  Let pain be your guide: If it hurts to do something, don't do it.  Pain is your body warning you to avoid that activity for another week until the pain goes down. d. You may drive when you are no longer taking prescription pain medication, you can comfortably wear a seatbelt, and you can safely maneuver your car and apply brakes. e. You may have sexual intercourse when it is comfortable.  9. FOLLOW UP in our office a. Please call [facility] at [phone] to set up an appointment to see your surgeon in the office for a follow-up appointment approximately 2-3 weeks after your surgery. b. Make sure that you call for this appointment the day you arrive home to insure a convenient appointment time.  10. IF YOU HAVE DISABILITY OR FAMILY LEAVE FORMS, BRING THEM TO THE OFFICE FOR PROCESSING.  DO NOT GIVE THEM TO YOUR DOCTOR.   WHEN TO CALL US [phone]: 1. Poor pain control 2. Reactions / problems with new medications (rash/itching, nausea, etc)  3. Fever over 101.5 F (38.5 C) 4. Inability to urinate 5. Nausea and/or vomiting 6. Worsening swelling or bruising 7. Continued bleeding from incision. 8. Increased pain, redness, or drainage from the incision   The clinic staff is available to   answer your questions during regular business hours (8:30am-5pm).  Please don't hesitate to call and ask to speak to one of our nurses for clinical concerns.   If you have a medical emergency, go to the nearest emergency room or call 911.  A surgeon from [facility] is always on call at the hospitals   [facility] [address], [address], [place], [place]  [address] ? MAIN: [phone] ? TOLL FREE: [phone] ?  FAX [phone] [email or web address]   

## 2019-05-21 NOTE — Discharge Summary (Signed)
Physician Discharge Summary    Patient ID: Karina Gomez MRN: [id number] DOB/AGE: Mar 09, 1961  58 y.o.  Patient Care Team: Eloisa NorthernAmin, Saad, MD as PCP - General (Internal Medicine) Axel Filleramirez, Armando, MD as Consulting Physician (General Surgery)  Admit date: 05/19/2019  Discharge date: 05/21/2019  Hospital Stay = 2 days    Discharge Diagnoses:  Principal Problem:   Paraesophageal hernia, recurrent, s/p robotic re-repair 05/19/2019 Active Problems:   Slipped Nissen fundoplication s/p robotic takedown & Toupet partial findoplication 05/19/2019   2 Days Post-Op  05/19/2019  POST-OPERATIVE DIAGNOSIS:   RECURRENT HIATAL HERNIA TYPE IV  SURGERY:  05/19/2019  Procedure(s): XI ROBOTIC ASSISTED REDO HIATAL HERNIA REPAIR WITH MESH TOUPET FUNDOPLICATION  SURGEON:    Surgeon(s): Axel Filleramirez, Armando, MD Karie SodaGross, Zakhari Fogel, MD  Consults: None  Hospital Course:   Patient with a recurrent hiatal hernia.  Underwent work-up and felt to benefit from rerepair.  Underwent robotic reduction of large recurrent paraesophageal hiatal hernia.  Partial posterior fundoplication done.  She struggled with nausea the first night but then feel better.  Swallow study delayed until hospital #2.  And then revealed no obstruction.  She advanced her pureed diet and felt much better.  No nausea.  Walking well.  Pain and other symptoms were treated aggressively.    By the time of discharge, the patient was walking well the hallways, eating dysphagia 1 pured diet,  having flatus.  Pain was well-controlled on an oral medications.  Based on meeting discharge criteria and continuing to recover, I felt it was safe for the patient to be discharged from the hospital to further recover with close followup. Postoperative recommendations were discussed in detail.  They are written as well.  Discharged Condition: good  Discharge Exam: Blood pressure 138/75, pulse 79, temperature 97.8 F (36.6 C), temperature source Oral,  resp. rate 18, height 5\' 3"  (1.6 m), weight 50.6 kg, SpO2 93 %.  General: Pt awake/alert/oriented x4 in No acute distress Eyes: PERRL, normal EOM.  Sclera clear.  No icterus Neuro: CN II-XII intact w/o focal sensory/motor deficits. Lymph: No head/neck/groin lymphadenopathy Psych:  No delerium/psychosis/paranoia HENT: Normocephalic, Mucus membranes moist.  No thrush Neck: Supple, No tracheal deviation Chest: No chest wall pain w good excursion CV:  Pulses intact.  Regular rhythm MS: Normal AROM mjr joints.  No obvious deformity Abdomen: Soft.  Nondistended.  Mildly tender at incisions only.  No evidence of peritonitis.  No incarcerated hernias. Ext:  SCDs BLE.  No mjr edema.  No cyanosis Skin: No petechiae / purpura   Disposition:   Follow-up Information    Axel Filleramirez, Armando, MD. Schedule an appointment as soon as possible for a visit in 2 weeks.   Specialty: General Surgery Why: Post op visit Contact information: [address] [place] [address] [phone]           Discharge disposition: 01-Home or Self Care       Discharge Instructions    Call MD for:   Complete by: As directed    Temperature > 101.30F   Call MD for:  extreme fatigue   Complete by: As directed    Call MD for:  hives   Complete by: As directed    Call MD for:  persistant nausea and vomiting   Complete by: As directed    Call MD for:  redness, tenderness, or signs of infection (pain, swelling, redness, odor or green/yellow discharge around incision site)   Complete by: As directed    Call  MD for:  severe uncontrolled pain   Complete by: As directed    Diet general   Complete by: As directed    SEE ESOPHAGEAL SURGERY DIET INSTRUCTIONS  We using usually start you out on a pureed (blenderized) diet. Expect some sticking with swallowing over the next 1-2 months.   This is due to swelling around your esophagus at the wrap & hiatal diaphragm repair.  It will gradually ease off over  the next few months.   Discharge instructions   Complete by: As directed    Please see discharge instruction sheets.   Also refer to any handouts/printouts that may have been given from the [facility] surgery office (if you visited [place] there before surgery) Please call our office if you have any questions or concerns [phone]   Discharge wound care:   Complete by: As directed    You have closed incisions: Shower and bathe over these incisions with soap and water every day.  It is OK to wash over the dressings: they are waterproof. Remove all surgical dressings on postoperative day #3.  You do not need to replace dressings over the closed incisions unless you feel more comfortable with a Band-Aid covering it.   Please call our office [phone] if you have further questions.   Driving Restrictions   Complete by: As directed    No driving until off narcotics and can safely swerve away without pain during an emergency   Increase activity slowly   Complete by: As directed    Lifting restrictions   Complete by: As directed    Avoid heavy lifting initially, <20 pounds at first.   Do not push through pain.   You have no specific weight limit: If it hurts to do, DON'T DO IT.    If you feel no pain, you are not injuring anything.  Pain will protect you from injury.   Coughing and sneezing are far more stressful to your incision than any lifting.   Avoid resuming heavy lifting (>50 pounds) or other intense activity until off all narcotic pain medications.   When want to exercise more, give yourself 2 weeks to gradually get back to full intense exercise/activity.   May shower / Bathe   Complete by: As directed    SHOWER EVERY DAY.  It is fine for dressings or wounds to be washed/rinsed.  Use gentle soap & water.  This will help the incisions and/or wounds get clean & minimize infection.   May walk up steps   Complete by: As directed    Remove dressing in 72 hours   Complete by: As directed     You have closed incisions: Shower and bathe over these incisions with soap and water every day.  It is OK to wash over the dressings: they are waterproof. Remove all surgical dressings on postoperative day #3.  You do not need to replace dressings over the closed incisions unless you feel more comfortable with a Band-Aid covering it.   Please call our office [phone] if you have further questions.   Sexual Activity Restrictions   Complete by: As directed    Sexual activity as tolerated.  Do not push through pain.  Pain will protect you from injury.   Walk with assistance   Complete by: As directed    Walk over an hour a day.  May use a walker/cane/companion to help with balance and stamina.      Allergies as of 05/21/2019  Reactions   Ibuprofen Nausea And Vomiting   Morphine And Related Nausea And Vomiting   "Feels weak and woozy"      Medication List    TAKE these medications   IRON-VITAMIN C PO Take 1 tablet by mouth daily.   ondansetron 4 MG tablet Commonly known as: ZOFRAN Take 1 tablet (4 mg total) by mouth every 8 (eight) hours as needed for nausea.   pantoprazole 40 MG tablet Commonly known as: PROTONIX Take 40 mg by mouth daily.   traMADol 50 MG tablet Commonly known as: ULTRAM Take 1-2 tablets (50-100 mg total) by mouth every 6 (six) hours as needed for moderate pain or severe pain.            Discharge Care Instructions  (From admission, onward)         Start     Ordered   05/21/19 0000  Discharge wound care:    Comments: You have closed incisions: Shower and bathe over these incisions with soap and water every day.  It is OK to wash over the dressings: they are waterproof. Remove all surgical dressings on postoperative day #3.  You do not need to replace dressings over the closed incisions unless you feel more comfortable with a Band-Aid covering it.   Please call our office [phone] if you have further questions.   05/21/19 1339           Significant Diagnostic Studies:  Results for orders placed or performed during the hospital encounter of 05/19/19 (from the past 72 hour(s))  Basic metabolic panel     Status: Abnormal   Collection Time: 05/20/19  4:50 AM  Result Value Ref Range   Sodium 138 135 - 145 mmol/L   Potassium 4.3 3.5 - 5.1 mmol/L   Chloride 103 98 - 111 mmol/L   CO2 26 22 - 32 mmol/L   Glucose, Bld 175 (H) 70 - 99 mg/dL   BUN 8 6 - 20 mg/dL   Creatinine, Ser 0.54 0.44 - 1.00 mg/dL   Calcium 8.8 (L) 8.9 - 10.3 mg/dL   GFR calc non Af Amer >60 >60 mL/min   GFR calc Af Amer >60 >60 mL/min   Anion gap 9 5 - 15    Comment: Performed at [facility], [place] [address], [place], [place] [address]  CBC     Status: Abnormal   Collection Time: 05/20/19  4:50 AM  Result Value Ref Range   WBC 7.1 4.0 - 10.5 K/uL   RBC 3.98 3.87 - 5.11 MIL/uL   Hemoglobin 11.8 (L) 12.0 - 15.0 g/dL   HCT 36.6 36.0 - 46.0 %   MCV 92.0 80.0 - 100.0 fL   MCH 29.6 26.0 - 34.0 pg   MCHC 32.2 30.0 - 36.0 g/dL   RDW 12.8 11.5 - 15.5 %   Platelets 131 (L) 150 - 400 K/uL   nRBC 0.0 0.0 - 0.2 %    Comment: Performed at [facility], [place] [address], [place], [place] [address]  Glucose, capillary     Status: Abnormal   Collection Time: 05/20/19  7:22 AM  Result Value Ref Range   Glucose-Capillary 131 (H) 70 - 99 mg/dL    No results found.  Past Medical History:  Diagnosis Date  . Medical history non-contributory     Past Surgical History:  Procedure Laterality Date  . ABDOMINAL HYSTERECTOMY    . HERNIA REPAIR  11/2018    Social History  Socioeconomic History  . Marital status: Married    Spouse name: Not on file  . Number of children: Not on file  . Years of education: Not on file  . Highest education level: Not on file  Occupational History  . Not on file  Tobacco Use  . Smoking status: Never Smoker  . Smokeless tobacco: Never Used  Substance and Sexual Activity  . Alcohol  use: Never  . Drug use: Never  . Sexual activity: Not on file  Other Topics Concern  . Not on file  Social History Narrative  . Not on file   Social Determinants of Health   Financial Resource Strain:   . Difficulty of Paying Living Expenses: Not on file  Food Insecurity:   . Worried About Programme researcher, broadcasting/film/video in the Last Year: Not on file  . Ran Out of Food in the Last Year: Not on file  Transportation Needs:   . Lack of Transportation (Medical): Not on file  . Lack of Transportation (Non-Medical): Not on file  Physical Activity:   . Days of Exercise per Week: Not on file  . Minutes of Exercise per Session: Not on file  Stress:   . Feeling of Stress : Not on file  Social Connections:   . Frequency of Communication with Friends and Family: Not on file  . Frequency of Social Gatherings with Friends and Family: Not on file  . Attends Religious Services: Not on file  . Active Member of Clubs or Organizations: Not on file  . Attends Banker Meetings: Not on file  . Marital Status: Not on file  Intimate Partner Violence:   . Fear of Current or Ex-Partner: Not on file  . Emotionally Abused: Not on file  . Physically Abused: Not on file  . Sexually Abused: Not on file    History reviewed. No pertinent family history.  Current Facility-Administered Medications  Medication Dose Route Frequency Provider Last Rate Last Admin  . 0.9 %  sodium chloride infusion  250 mL Intravenous PRN Karie Soda, MD      . acetaminophen (TYLENOL) suppository 650 mg  650 mg Rectal Q6H PRN Karie Soda, MD      . alum & mag hydroxide-simeth (MAALOX/MYLANTA) 200-200-20 MG/5ML suspension 30 mL  30 mL Oral Q6H PRN Karie Soda, MD      . bisacodyl (DULCOLAX) suppository 10 mg  10 mg Rectal Q12H PRN Karie Soda, MD      . Chlorhexidine Gluconate Cloth 2 % PADS 6 each  6 each Topical Daily Axel Filler, MD   6 each at 05/21/19 [phone]  . dexamethasone (DECADRON) injection 4 mg  4 mg  Intravenous Catha Gosselin, MD   4 mg at 05/21/19 7824  . diphenhydrAMINE (BENADRYL) injection 12.5-25 mg  12.5-25 mg Intravenous Q6H PRN Karie Soda, MD      . enoxaparin (LOVENOX) injection 40 mg  40 mg Subcutaneous Q24H Axel Filler, MD   40 mg at 05/21/19 0757  . HYDROmorphone (DILAUDID) injection 1-2 mg  1-2 mg Intravenous Q2H PRN Axel Filler, MD   1 mg at 05/21/19 0442  . lactated ringers bolus 1,000 mL  1,000 mL Intravenous Q8H PRN Karie Soda, MD      . lactated ringers bolus 1,000 mL  1,000 mL Intravenous Q8H PRN Breyden Jeudy, Viviann Spare, MD      . lip balm (CARMEX) ointment 1 application  1 application Topical BID Karie Soda, MD   1 application at 05/21/19 [phone]  .  magic mouthwash  15 mL Oral QID PRN Karie Soda, MD      . menthol-cetylpyridinium (CEPACOL) lozenge 3 mg  1 lozenge Oral PRN Karie Soda, MD      . methocarbamol (ROBAXIN) 1,000 mg in dextrose 5 % 100 mL IVPB  1,000 mg Intravenous Q6H PRN Karie Soda, MD      . metoCLOPramide (REGLAN) injection 5-10 mg  5-10 mg Intravenous Q8H PRN Karie Soda, MD      . ondansetron (ZOFRAN-ODT) disintegrating tablet 4 mg  4 mg Oral Q6H PRN Axel Filler, MD       Or  . ondansetron [facility]) injection 4 mg  4 mg Intravenous Q6H PRN Axel Filler, MD   4 mg at 05/20/19 0132  . phenol (CHLORASEPTIC) mouth spray 2 spray  2 spray Mouth/Throat PRN Karie Soda, MD      . prochlorperazine (COMPAZINE) injection 5-10 mg  5-10 mg Intravenous Q4H PRN Karie Soda, MD   5 mg at 05/20/19 0455  . scopolamine (TRANSDERM-SCOP) 1 MG/3DAYS 1.5 mg  1 patch Transdermal Raliegh Ip, MD   1.5 mg at 05/20/19 1403  . simethicone (MYLICON) 40 MG/0.6ML suspension 40 mg  40 mg Oral QID PRN Karie Soda, MD      . sodium chloride flush (NS) 0.9 % injection 3 mL  3 mL Intravenous Catha Gosselin, MD   3 mL at 05/21/19 0954  . sodium chloride flush (NS) 0.9 % injection 3 mL  3 mL Intravenous PRN Karie Soda, MD       Current  Outpatient Medications  Medication Sig Dispense Refill  . IRON-VITAMIN C PO Take 1 tablet by mouth daily.    . pantoprazole (PROTONIX) 40 MG tablet Take 40 mg by mouth daily.    . ondansetron (ZOFRAN) 4 MG tablet Take 1 tablet (4 mg total) by mouth every 8 (eight) hours as needed for nausea. 8 tablet 5  . traMADol (ULTRAM) 50 MG tablet Take 1-2 tablets (50-100 mg total) by mouth every 6 (six) hours as needed for moderate pain or severe pain. 20 tablet 0     Allergies  Allergen Reactions  . Ibuprofen Nausea And Vomiting  . Morphine And Related Nausea And Vomiting    "Feels weak and woozy"    Signed: Lorenso Courier, MD, FACS, MASCRS Gastrointestinal and Minimally Invasive Surgery  [facility] [address] [place], [place] [address] [phone] Main / Paging [phone] Fax     05/21/2019, 4:17 PM

## 2020-04-22 IMAGING — RF DG ESOPHAGUS
12 series · 24 of 24 positions shown · non-contrast
Comparison: CT chest 01/23/2019; upper GI examination from
06/15/2018

CLINICAL DATA: Recurrent hiatal hernia.

EXAM:
ESOPHOGRAM/BARIUM SWALLOW
TECHNIQUE: Single contrast examination was performed using  thin barium.
FLUOROSCOPY TIME:  Fluoroscopy Time:  2 minutes, 42 seconds
Radiation Exposure Index (if provided by the fluoroscopic device):
13.4 mGy
Number of Acquired Spot Images: 0

[Series 1: cp_standard · 0.37mm/px · 2 of 65 frames shown (1 of 12)]
[frame 10/65]
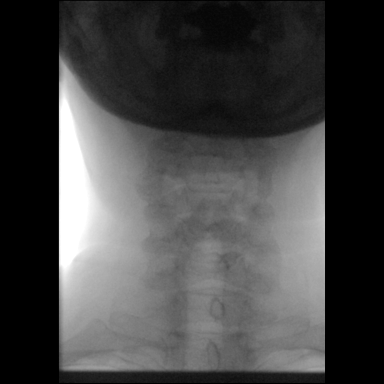
[frame 56/65]
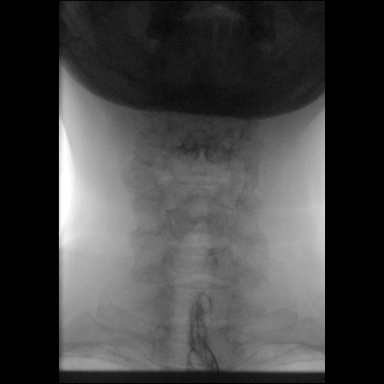

[Series 2: cp_standard · 0.36mm/px · 1 of 33 frames shown (2 of 12)]
[frame 17/33]
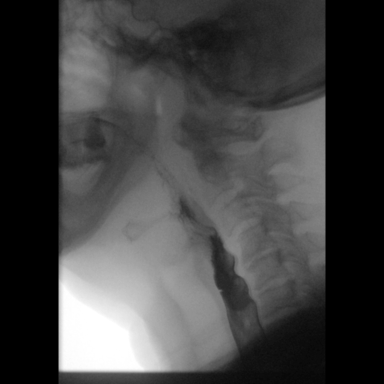

[Series 3: cp_standard · 0.36mm/px · 2 of 57 frames shown (3 of 12)]
[frame 9/57]
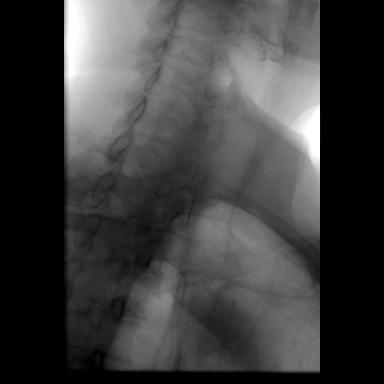
[frame 29/57]
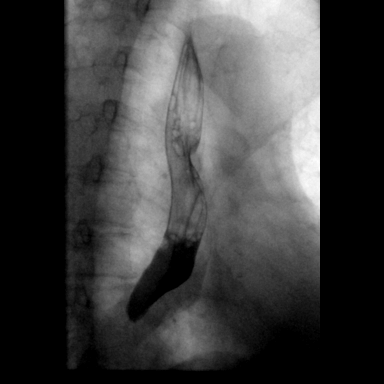

[Series 4: cp_standard · 0.36mm/px · 2 of 58 frames shown (4 of 12)]
[frame 7/58]
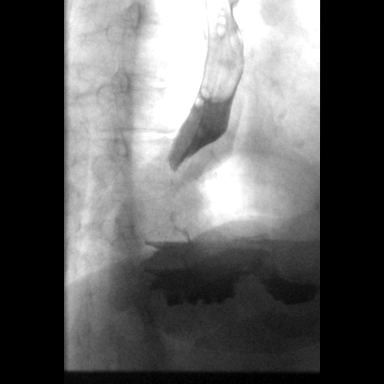
[frame 30/58]
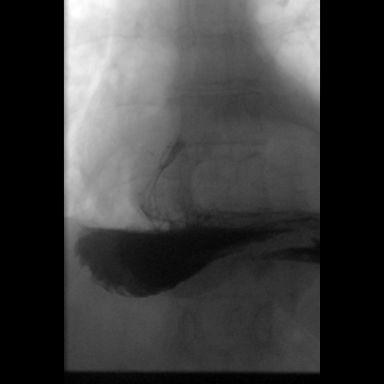

[Series 5: cp_standard · 0.36mm/px · 2 of 59 frames shown (5 of 12)]
[frame 6/59]
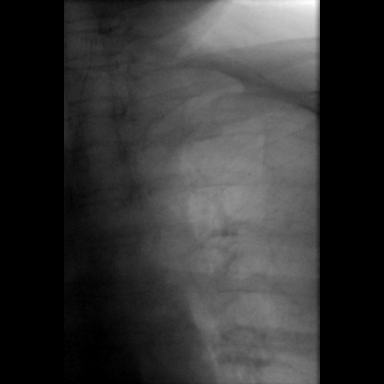
[frame 30/59]
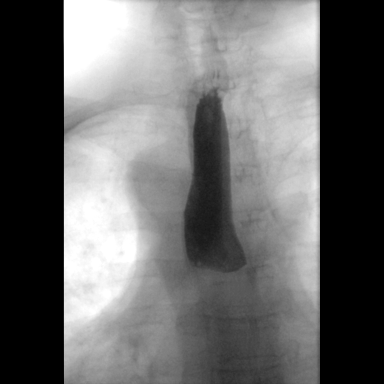

[Series 6: cp_standard · 0.36mm/px · 2 of 45 frames shown (6 of 12)]
[frame 7/45]
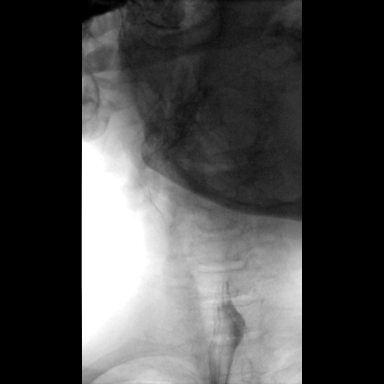
[frame 23/45]
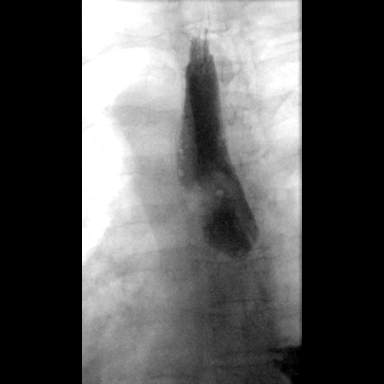

[Series 7: cp_standard · 0.36mm/px · 3 of 45 frames shown (7 of 12)]
[frame 7/45]
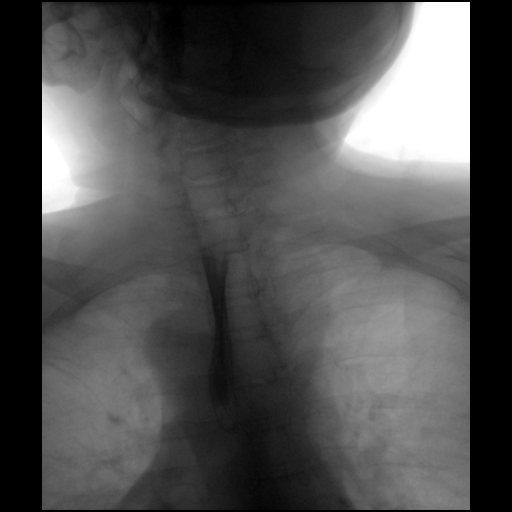
[frame 8/45]
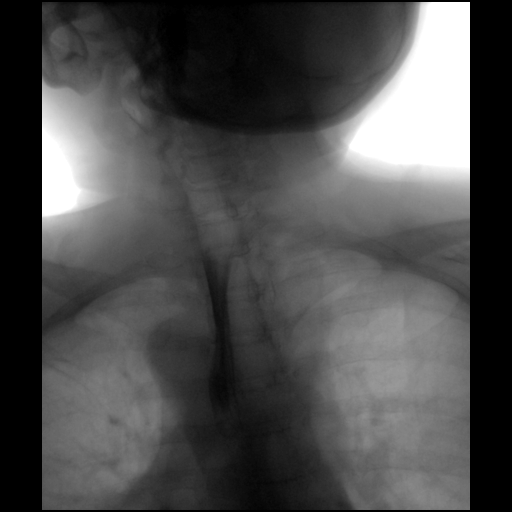
[frame 39/45]
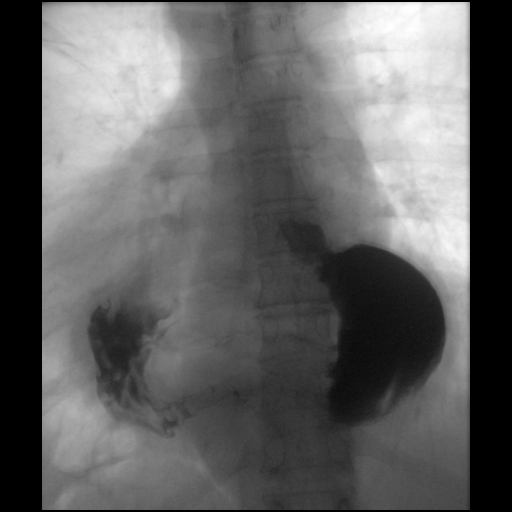

[Series 8: cp_standard · 0.36mm/px · 2 of 24 frames shown (8 of 12)]
[frame 4/24]
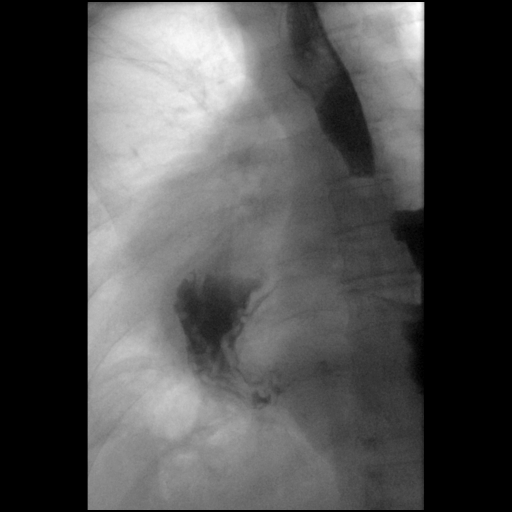
[frame 21/24]
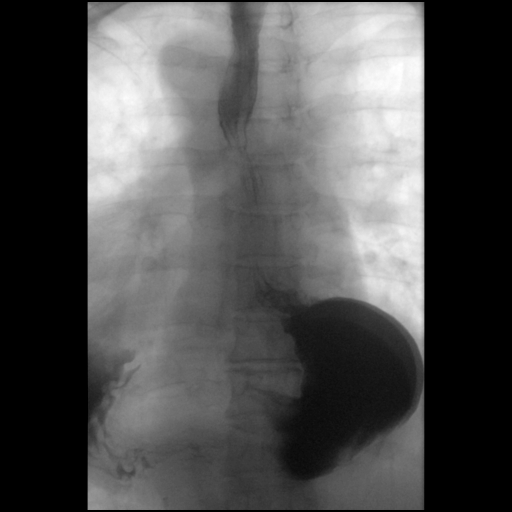

[Series 9: cp_standard · 0.54mm/px · 2 of 18 frames shown (9 of 12)]
[frame 3/18]
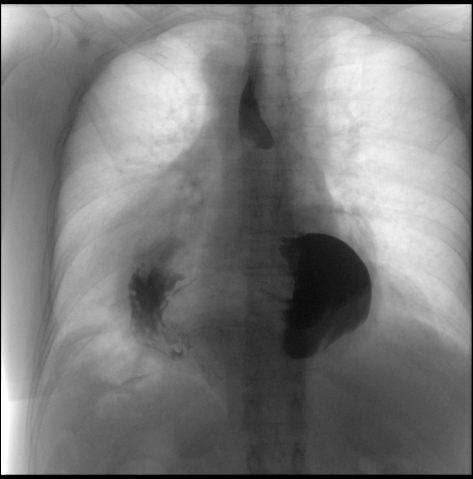
[frame 16/18]
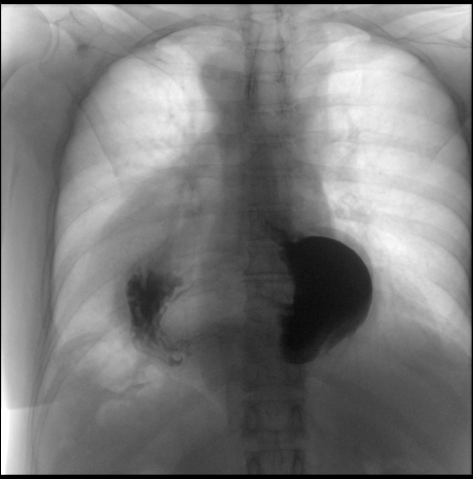

[Series 10: cp_standard · 0.54mm/px · 2 of 19 frames shown (10 of 12)]
[frame 9/19]
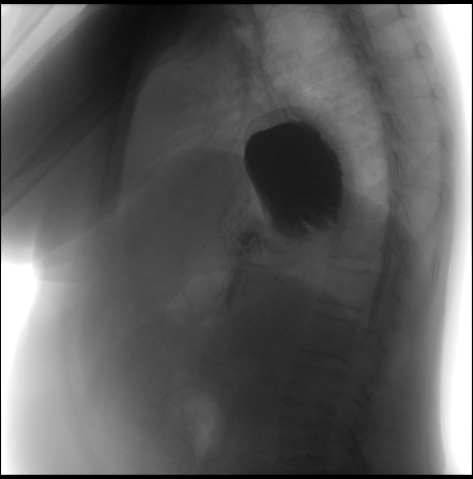
[frame 17/19]
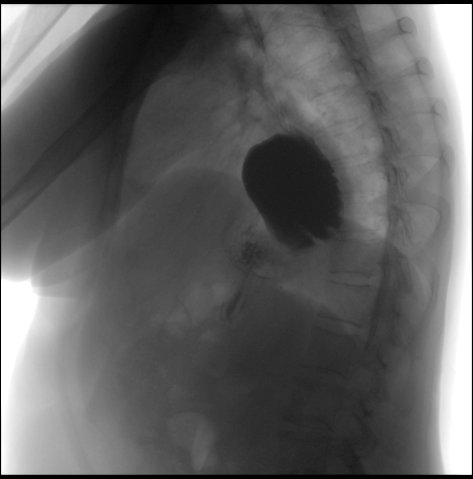

[Series 11: cp_standard · 0.54mm/px · 2 of 17 frames shown (11 of 12)]
[frame 9/17]
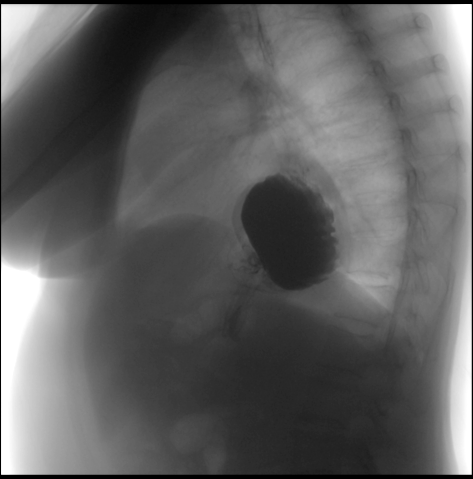
[frame 15/17]
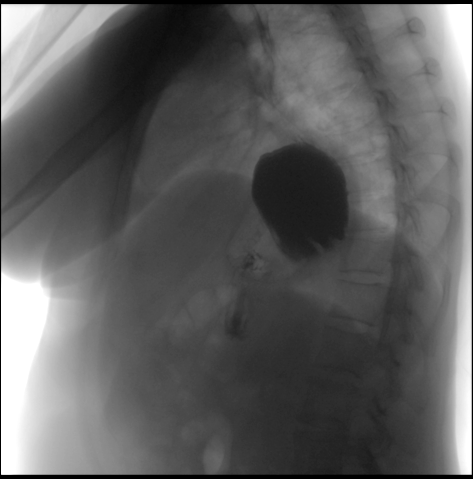

[Series 12: cp_standard · 0.54mm/px · 2 of 34 frames shown (12 of 12)]
[frame 13/34]
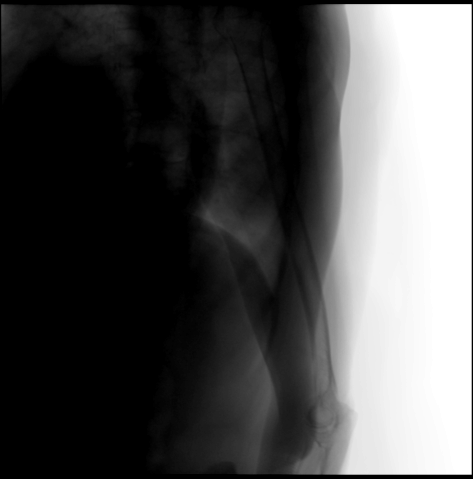
[frame 29/34]
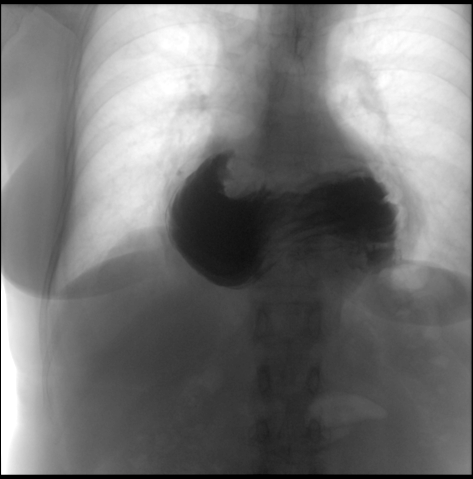

[24 of 24 positions shown; findings below may reference images not displayed]

FINDINGS: The pharyngeal phase of swallowing appears normal.

Primary peristaltic waves are intact on [DATE] swallows.

A large hiatal hernia contains most of the stomach. The gastric
cardia is eccentric to the right with the distal body and antrum
eccentric to the left best appreciated in the upright position,
there is some mild narrowing in the distal most esophagus, with the
lumen measured at 0.9 cm on image 40/3. The lumen may measure up to
1.4 cm on image 34/7. The mild narrowing in this vicinity may
indicate residual fundoplication wrap.

A 13 mm barium tablet was administered orally to the patient but she
was unable to swallow it despite multiple attempts.
IMPRESSION: 1. Recurrent large hiatal hernia containing most of the stomach.
Mild narrowing in the distal esophagus, lumen measuring up to 1.4 cm
in diameter, probably from residual fundoplication.
2. The patient was unable to swallow the barium tablet.
3. No significant esophageal dysmotility identified. No esophageal
ulcer observed.

## 2023-05-05 ENCOUNTER — Ambulatory Visit: Payer: Commercial Managed Care - PPO | Admitting: Internal Medicine

## 2023-05-28 ENCOUNTER — Ambulatory Visit: Payer: Commercial Managed Care - PPO | Admitting: Internal Medicine

## 2023-05-28 VITALS — BP 124/78 | HR 85 | Temp 97.6°F | Resp 18 | Ht 63.0 in | Wt 125.0 lb

## 2023-05-28 DIAGNOSIS — M25561 Pain in right knee: Secondary | ICD-10-CM | POA: Diagnosis not present

## 2023-05-28 MED ORDER — MELOXICAM 7.5 MG PO TBDP: 1.0000 | ORAL_TABLET | Freq: Every day | ORAL | 1 refills | Status: AC

## 2023-05-28 NOTE — Assessment & Plan Note (Signed)
She will take meloxicam 7.5 mg daily for 4 weeks then as needed. She can also combine tylenol arthritis with it. If not better then may need xray of knee.

## 2023-05-28 NOTE — Progress Notes (Signed)
   Office Visit  Subjective   Patient ID: Karina Gomez   DOB: 1961-04-15   Age: 62 y.o.   MRN: [id number]   Chief Complaint Chief Complaint  Patient presents with   Follow-up     History of Present Illness  62 years old female is here c/o pain in her legs when she goes upstair or walk longer distance. She says pain is not every day but when it comes it will last all days. No fall and no injury.      Past Medical History Past Medical History:  Diagnosis Date   Medical history non-contributory      Allergies Allergies  Allergen Reactions   Ibuprofen Nausea And Vomiting   Morphine And Codeine Nausea And Vomiting    "Feels weak and woozy"     Review of Systems Review of Systems  Constitutional: Negative.   Respiratory: Negative.    Cardiovascular: Negative.   Musculoskeletal:  Positive for joint pain.  Neurological: Negative.        Objective:    Vitals BP 124/78 (BP Location: Left Arm, Patient Position: Sitting, Cuff Size: Normal)   Pulse 85   Temp 97.6 F (36.4 C)   Resp 18   Ht 5\' 3"  (1.6 m)   Wt 125 lb (56.7 kg)   SpO2 97%   BMI 22.14 kg/m    Physical Examination Physical Exam Constitutional:      Appearance: Normal appearance.  Musculoskeletal:        General: Tenderness present.     Comments: Tenderness right knee at joint.   Neurological:     General: No focal deficit present.     Mental Status: She is alert and oriented to person, place, and time.        Assessment & Plan:   Acute pain of right knee She will take meloxicam 7.5 mg daily for 4 weeks then as needed. She can also combine tylenol arthritis with it. If not better then may need xray of knee.    No follow-ups on file.   Eloisa Northern, MD
